# Patient Record
Sex: Female | Born: 1970 | Race: White | Hispanic: No | Smoking: Former smoker
Health system: Southern US, Community
[De-identification: ages and names within clinical notes are randomized; demographics above are authoritative.]

## PROBLEM LIST (undated history)

## (undated) DIAGNOSIS — G4730 Sleep apnea, unspecified: Secondary | ICD-10-CM

## (undated) DIAGNOSIS — N393 Stress incontinence (female) (male): Secondary | ICD-10-CM

## (undated) DIAGNOSIS — F32A Depression, unspecified: Secondary | ICD-10-CM

## (undated) DIAGNOSIS — M1990 Unspecified osteoarthritis, unspecified site: Secondary | ICD-10-CM

## (undated) DIAGNOSIS — F329 Major depressive disorder, single episode, unspecified: Secondary | ICD-10-CM

## (undated) DIAGNOSIS — E282 Polycystic ovarian syndrome: Secondary | ICD-10-CM

## (undated) DIAGNOSIS — K219 Gastro-esophageal reflux disease without esophagitis: Secondary | ICD-10-CM

## (undated) DIAGNOSIS — R51 Headache: Secondary | ICD-10-CM

## (undated) DIAGNOSIS — F99 Mental disorder, not otherwise specified: Secondary | ICD-10-CM

## (undated) DIAGNOSIS — F319 Bipolar disorder, unspecified: Secondary | ICD-10-CM

## (undated) HISTORY — DX: Stress incontinence (female) (male): N39.3

## (undated) HISTORY — DX: Gastro-esophageal reflux disease without esophagitis: K21.9

## (undated) HISTORY — DX: Polycystic ovarian syndrome: E28.2

## (undated) HISTORY — DX: Bipolar disorder, unspecified: F31.9

## (undated) HISTORY — DX: Unspecified osteoarthritis, unspecified site: M19.90

---

## 2004-05-27 ENCOUNTER — Encounter (INDEPENDENT_AMBULATORY_CARE_PROVIDER_SITE_OTHER): Payer: Self-pay | Admitting: Internal Medicine

## 2004-05-27 LAB — CONVERTED CEMR LAB: Pap Smear: NORMAL

## 2004-06-08 ENCOUNTER — Emergency Department (HOSPITAL_COMMUNITY): Admission: EM | Admit: 2004-06-08 | Discharge: 2004-06-08 | Payer: Self-pay | Admitting: Emergency Medicine

## 2005-03-26 ENCOUNTER — Inpatient Hospital Stay (HOSPITAL_COMMUNITY): Admission: RE | Admit: 2005-03-26 | Discharge: 2005-03-29 | Payer: Self-pay | Admitting: Psychiatry

## 2005-03-27 ENCOUNTER — Ambulatory Visit: Payer: Self-pay | Admitting: Psychiatry

## 2005-05-08 ENCOUNTER — Ambulatory Visit: Payer: Self-pay | Admitting: Psychiatry

## 2005-05-30 ENCOUNTER — Ambulatory Visit: Payer: Self-pay | Admitting: Psychiatry

## 2005-07-04 ENCOUNTER — Ambulatory Visit: Payer: Self-pay | Admitting: Psychiatry

## 2005-07-25 ENCOUNTER — Ambulatory Visit: Payer: Self-pay | Admitting: Psychiatry

## 2005-08-01 ENCOUNTER — Ambulatory Visit (HOSPITAL_COMMUNITY): Payer: Self-pay | Admitting: Psychiatry

## 2005-08-16 ENCOUNTER — Ambulatory Visit (HOSPITAL_COMMUNITY): Payer: Self-pay | Admitting: Psychiatry

## 2005-08-22 ENCOUNTER — Ambulatory Visit (HOSPITAL_COMMUNITY): Payer: Self-pay | Admitting: Psychiatry

## 2005-09-19 ENCOUNTER — Ambulatory Visit (HOSPITAL_COMMUNITY): Payer: Self-pay | Admitting: Psychiatry

## 2005-10-17 ENCOUNTER — Ambulatory Visit (HOSPITAL_COMMUNITY): Payer: Self-pay | Admitting: Psychiatry

## 2005-11-07 ENCOUNTER — Ambulatory Visit (HOSPITAL_COMMUNITY): Payer: Self-pay | Admitting: Psychiatry

## 2005-12-19 ENCOUNTER — Ambulatory Visit (HOSPITAL_COMMUNITY): Payer: Self-pay | Admitting: Psychiatry

## 2006-04-08 ENCOUNTER — Ambulatory Visit (HOSPITAL_COMMUNITY): Payer: Self-pay | Admitting: Psychiatry

## 2006-04-30 ENCOUNTER — Ambulatory Visit: Payer: Self-pay | Admitting: Internal Medicine

## 2006-04-30 LAB — CONVERTED CEMR LAB
HDL: 41 mg/dL
LDL Cholesterol: 134 mg/dL

## 2006-05-31 ENCOUNTER — Encounter: Payer: Self-pay | Admitting: Internal Medicine

## 2006-05-31 DIAGNOSIS — G43909 Migraine, unspecified, not intractable, without status migrainosus: Secondary | ICD-10-CM | POA: Insufficient documentation

## 2006-05-31 DIAGNOSIS — J45909 Unspecified asthma, uncomplicated: Secondary | ICD-10-CM | POA: Insufficient documentation

## 2006-05-31 DIAGNOSIS — R32 Unspecified urinary incontinence: Secondary | ICD-10-CM | POA: Insufficient documentation

## 2006-05-31 DIAGNOSIS — Z9189 Other specified personal risk factors, not elsewhere classified: Secondary | ICD-10-CM | POA: Insufficient documentation

## 2006-05-31 DIAGNOSIS — E282 Polycystic ovarian syndrome: Secondary | ICD-10-CM | POA: Insufficient documentation

## 2006-05-31 DIAGNOSIS — K219 Gastro-esophageal reflux disease without esophagitis: Secondary | ICD-10-CM | POA: Insufficient documentation

## 2006-05-31 DIAGNOSIS — J3089 Other allergic rhinitis: Secondary | ICD-10-CM | POA: Insufficient documentation

## 2006-05-31 DIAGNOSIS — F329 Major depressive disorder, single episode, unspecified: Secondary | ICD-10-CM | POA: Insufficient documentation

## 2006-05-31 DIAGNOSIS — F411 Generalized anxiety disorder: Secondary | ICD-10-CM | POA: Insufficient documentation

## 2006-05-31 DIAGNOSIS — E669 Obesity, unspecified: Secondary | ICD-10-CM | POA: Insufficient documentation

## 2006-05-31 DIAGNOSIS — F319 Bipolar disorder, unspecified: Secondary | ICD-10-CM | POA: Insufficient documentation

## 2006-06-10 ENCOUNTER — Ambulatory Visit: Payer: Self-pay | Admitting: Internal Medicine

## 2006-06-10 LAB — CONVERTED CEMR LAB: CO2: 20 meq/L (ref 19–32)

## 2006-07-08 ENCOUNTER — Ambulatory Visit (HOSPITAL_COMMUNITY): Payer: Self-pay | Admitting: Psychiatry

## 2006-09-17 ENCOUNTER — Encounter (INDEPENDENT_AMBULATORY_CARE_PROVIDER_SITE_OTHER): Payer: Self-pay | Admitting: Internal Medicine

## 2006-09-19 ENCOUNTER — Ambulatory Visit: Payer: Self-pay | Admitting: Internal Medicine

## 2006-09-19 DIAGNOSIS — M549 Dorsalgia, unspecified: Secondary | ICD-10-CM | POA: Insufficient documentation

## 2006-09-19 DIAGNOSIS — L680 Hirsutism: Secondary | ICD-10-CM | POA: Insufficient documentation

## 2006-09-23 ENCOUNTER — Encounter (INDEPENDENT_AMBULATORY_CARE_PROVIDER_SITE_OTHER): Payer: Self-pay | Admitting: Internal Medicine

## 2006-10-07 ENCOUNTER — Ambulatory Visit (HOSPITAL_COMMUNITY): Payer: Self-pay | Admitting: Psychiatry

## 2007-01-02 ENCOUNTER — Encounter (INDEPENDENT_AMBULATORY_CARE_PROVIDER_SITE_OTHER): Payer: Self-pay | Admitting: Internal Medicine

## 2007-01-06 ENCOUNTER — Ambulatory Visit (HOSPITAL_COMMUNITY): Payer: Self-pay | Admitting: Psychiatry

## 2007-03-23 ENCOUNTER — Ambulatory Visit: Payer: Self-pay | Admitting: Internal Medicine

## 2007-03-23 DIAGNOSIS — J209 Acute bronchitis, unspecified: Secondary | ICD-10-CM | POA: Insufficient documentation

## 2007-03-23 LAB — CONVERTED CEMR LAB: Inflenza A Ag: NEGATIVE

## 2007-03-25 ENCOUNTER — Encounter (INDEPENDENT_AMBULATORY_CARE_PROVIDER_SITE_OTHER): Payer: Self-pay | Admitting: Internal Medicine

## 2007-03-25 ENCOUNTER — Telehealth (INDEPENDENT_AMBULATORY_CARE_PROVIDER_SITE_OTHER): Payer: Self-pay | Admitting: Internal Medicine

## 2007-04-06 ENCOUNTER — Telehealth (INDEPENDENT_AMBULATORY_CARE_PROVIDER_SITE_OTHER): Payer: Self-pay | Admitting: *Deleted

## 2007-04-09 ENCOUNTER — Encounter (INDEPENDENT_AMBULATORY_CARE_PROVIDER_SITE_OTHER): Payer: Self-pay | Admitting: Internal Medicine

## 2007-04-16 ENCOUNTER — Ambulatory Visit (HOSPITAL_COMMUNITY): Payer: Self-pay | Admitting: Psychiatry

## 2007-05-09 ENCOUNTER — Encounter (INDEPENDENT_AMBULATORY_CARE_PROVIDER_SITE_OTHER): Payer: Self-pay | Admitting: Internal Medicine

## 2007-06-10 ENCOUNTER — Ambulatory Visit: Payer: Self-pay | Admitting: Internal Medicine

## 2007-06-10 DIAGNOSIS — M545 Low back pain, unspecified: Secondary | ICD-10-CM | POA: Insufficient documentation

## 2007-06-10 DIAGNOSIS — Z87891 Personal history of nicotine dependence: Secondary | ICD-10-CM | POA: Insufficient documentation

## 2007-08-18 ENCOUNTER — Ambulatory Visit (HOSPITAL_COMMUNITY): Payer: Self-pay | Admitting: Psychiatry

## 2007-11-05 ENCOUNTER — Ambulatory Visit (HOSPITAL_COMMUNITY): Payer: Self-pay | Admitting: Psychiatry

## 2007-12-15 ENCOUNTER — Telehealth (INDEPENDENT_AMBULATORY_CARE_PROVIDER_SITE_OTHER): Payer: Self-pay | Admitting: *Deleted

## 2008-01-20 ENCOUNTER — Ambulatory Visit: Payer: Self-pay | Admitting: Internal Medicine

## 2008-01-25 LAB — CONVERTED CEMR LAB
CO2: 22 meq/L (ref 19–32)
Calcium: 9.5 mg/dL (ref 8.4–10.5)
Creatinine, Ser: 0.79 mg/dL (ref 0.40–1.20)
Glucose, Bld: 111 mg/dL — ABNORMAL HIGH (ref 70–99)

## 2008-03-03 ENCOUNTER — Ambulatory Visit (HOSPITAL_COMMUNITY): Payer: Self-pay | Admitting: Psychiatry

## 2008-03-11 ENCOUNTER — Encounter (INDEPENDENT_AMBULATORY_CARE_PROVIDER_SITE_OTHER): Payer: Self-pay | Admitting: Internal Medicine

## 2008-03-25 ENCOUNTER — Other Ambulatory Visit: Admission: RE | Admit: 2008-03-25 | Discharge: 2008-03-25 | Payer: Self-pay | Admitting: Internal Medicine

## 2008-03-25 ENCOUNTER — Ambulatory Visit: Payer: Self-pay | Admitting: Internal Medicine

## 2008-03-25 ENCOUNTER — Encounter (INDEPENDENT_AMBULATORY_CARE_PROVIDER_SITE_OTHER): Payer: Self-pay | Admitting: Internal Medicine

## 2008-03-25 DIAGNOSIS — R7309 Other abnormal glucose: Secondary | ICD-10-CM | POA: Insufficient documentation

## 2008-03-28 LAB — CONVERTED CEMR LAB: Glucose, Bld: 104 mg/dL — ABNORMAL HIGH (ref 70–99)

## 2008-06-15 ENCOUNTER — Ambulatory Visit: Payer: Self-pay | Admitting: Internal Medicine

## 2008-06-15 DIAGNOSIS — J069 Acute upper respiratory infection, unspecified: Secondary | ICD-10-CM | POA: Insufficient documentation

## 2008-08-25 ENCOUNTER — Ambulatory Visit (HOSPITAL_COMMUNITY): Payer: Self-pay | Admitting: Psychiatry

## 2008-12-02 ENCOUNTER — Encounter (INDEPENDENT_AMBULATORY_CARE_PROVIDER_SITE_OTHER): Payer: Self-pay | Admitting: Internal Medicine

## 2009-01-24 ENCOUNTER — Ambulatory Visit: Payer: Self-pay | Admitting: Internal Medicine

## 2009-01-24 DIAGNOSIS — M79609 Pain in unspecified limb: Secondary | ICD-10-CM | POA: Insufficient documentation

## 2009-01-26 ENCOUNTER — Ambulatory Visit (HOSPITAL_COMMUNITY): Payer: Self-pay | Admitting: Psychiatry

## 2009-02-01 ENCOUNTER — Encounter (INDEPENDENT_AMBULATORY_CARE_PROVIDER_SITE_OTHER): Payer: Self-pay | Admitting: Internal Medicine

## 2009-07-25 ENCOUNTER — Ambulatory Visit (HOSPITAL_COMMUNITY): Payer: Self-pay | Admitting: Psychiatry

## 2009-08-23 ENCOUNTER — Ambulatory Visit: Payer: Self-pay | Admitting: Orthopedic Surgery

## 2009-08-23 DIAGNOSIS — IMO0002 Reserved for concepts with insufficient information to code with codable children: Secondary | ICD-10-CM | POA: Insufficient documentation

## 2009-08-23 DIAGNOSIS — M2550 Pain in unspecified joint: Secondary | ICD-10-CM | POA: Insufficient documentation

## 2009-08-23 DIAGNOSIS — M1710 Unilateral primary osteoarthritis, unspecified knee: Secondary | ICD-10-CM

## 2009-08-25 ENCOUNTER — Encounter (INDEPENDENT_AMBULATORY_CARE_PROVIDER_SITE_OTHER): Payer: Self-pay | Admitting: *Deleted

## 2009-09-04 ENCOUNTER — Encounter (INDEPENDENT_AMBULATORY_CARE_PROVIDER_SITE_OTHER): Payer: Self-pay | Admitting: *Deleted

## 2009-09-12 ENCOUNTER — Encounter: Payer: Self-pay | Admitting: Orthopedic Surgery

## 2009-09-19 ENCOUNTER — Telehealth: Payer: Self-pay | Admitting: Orthopedic Surgery

## 2009-10-09 ENCOUNTER — Telehealth: Payer: Self-pay | Admitting: Orthopedic Surgery

## 2009-10-31 ENCOUNTER — Ambulatory Visit (HOSPITAL_COMMUNITY): Payer: Self-pay | Admitting: Psychiatry

## 2010-01-30 ENCOUNTER — Ambulatory Visit (HOSPITAL_COMMUNITY): Payer: Self-pay | Admitting: Psychiatry

## 2010-05-01 ENCOUNTER — Ambulatory Visit (HOSPITAL_COMMUNITY): Payer: Self-pay | Admitting: Psychiatry

## 2010-06-26 NOTE — Consult Note (Signed)
Summary: Consult notes from Dr. Harlow Mares  Consult notes from Dr. Harlow Mares   Imported By: Jacklynn Ganong 09/12/2009 11:10:30  _____________________________________________________________________  External Attachment:    Type:   Image     Comment:   External Document

## 2010-06-26 NOTE — Letter (Signed)
Summary: Historic Patient File  Historic Patient File   Imported By: Elvera Maria 08/25/2009 10:29:43  _____________________________________________________________________  External Attachment:    Type:   Image     Comment:   history form

## 2010-06-26 NOTE — Progress Notes (Signed)
Summary: no reply to referral appointment  Phone Note Other Incoming   Summary of Call: Someone left a message (did not leave a name) from [facility] that they have left messages for [name] to call their office to schedule an appointment with Dr. Corliss Skains.  So far there has been no reply to the messages. Initial call taken by: Jacklynn Ganong,  September 19, 2009 4:16 PM

## 2010-06-26 NOTE — Miscellaneous (Signed)
Summary: faxed referral to dr Dareen Piano for rheumatoid per pt  Clinical Lists Changes  Orders: Added new Referral order of Rheumatology Referral (Rheumatology) - Signed

## 2010-06-26 NOTE — Miscellaneous (Signed)
Summary: faxed notes to deveshwar for referral  Clinical Lists Changes

## 2010-06-26 NOTE — Letter (Signed)
Summary: *Orthopedic Consult Note  [facility]  [address] [place]  [place], [place] [address]   Phone: [phone]  Fax: [phone]    Re:    [name] DOB:    Aug 20, 1970   Dear: [name]   Thank you for requesting that we see the above patient for consultation.  A copy of the detailed office note will be sent under separate cover, for your review.  Evaluation today is consistent with:  1)  KNEE, ARTHRITIS, DEGEN./OSTEO (ICD-715.96) 2)  PAIN IN JOINT, MULTIPLE SITES (ICD-719.49)   Our recommendation is for: referral to rheumatologist.  She is not a surgical candidate at this time based on age and x-ray findings.       Thank you for this opportunity to look after your patient.  Sincerely,   Terrance Mass. MD.

## 2010-06-26 NOTE — Progress Notes (Signed)
Summary: unable to contact patient  Phone Note Other Incoming   Summary of Call: [name]/[facility] has not been able to reach [name] to schedule an appointment with Dr. Corliss Skains.. None of her calls have been returned.   Said she will shred all paperwork saent from out office. Initial call taken by: Jacklynn Ganong,  Oct 09, 2009 2:02 PM

## 2010-06-26 NOTE — Assessment & Plan Note (Signed)
Summary: LT KNEE PAIN/DEGEN ARTHRITIS/REF GUARINO/HAD XR"s,BRING'G FIL...   Vital Signs:  Patient profile:   40 year old female Pulse rate:   78 / minute Resp:     16 per minute  Vitals Entered By: Fuller Canada MD (August 23, 2009 10:37 AM)  Visit Type:  E/M Referring Provider:  Dr. Wende Crease Primary Provider:  DR Loney Hering  CC:  left knee pain.  History of Present Illness: 40 year old female with long history of multiple joint aches and pains with frequent flares of knee swelling primarily the LEFT knee and occasionally the RIGHT knee.  She's had pain in her knees for over 20 years.  She couldn't see her primary care physician so she went to the urgent care clinic and eventually had x-rays and an MRI of the LEFT knee she has osteoarthritis no meniscal tears no ligament damage but it is quite severe for her age of 8.  She complains of sharp throbbing mild pain which is constant worse in the morning and after exercise improve with rest heat ibuprofen and prednisone associated with some catching swelling and locking.  Today she is having some back pain. Xrays today.  Medications: Nexium, Wellbutrin, Serequil, Ambien, Diflocenac sodium, Flexeril, Spirolacton.  Allergies: 1)  Pcn 2)  Ceclor  Review of Systems Constitutional:  Denies weight loss, weight gain, fever, chills, and fatigue. Cardiovascular:  Denies chest pain, palpitations, fainting, and murmurs. Respiratory:  Complains of snoring; denies short of breath, wheezing, couch, tightness, pain on inspiration, and snoring . Gastrointestinal:  Complains of heartburn; denies nausea, vomiting, diarrhea, constipation, and blood in your stools. Genitourinary:  Denies frequency, urgency, difficulty urinating, painful urination, flank pain, and bleeding in urine. Neurologic:  Complains of numbness and tingling; denies unsteady gait, dizziness, tremors, and seizure. Musculoskeletal:  Complains of joint pain, swelling, instability, stiffness,  heat, and muscle pain; denies redness. Endocrine:  Denies excessive thirst, exessive urination, and heat or cold intolerance. Psychiatric:  Complains of depression and anxiety; denies nervousness and hallucinations. Skin:  Denies changes in the skin, poor healing, rash, itching, and redness. HEENT:  Denies blurred or double vision, eye pain, redness, and watering. Immunology:  Complains of seasonal allergies; denies sinus problems and allergic to bee stings; Adverse reaction to food- milk protein. Hemoatologic:  Denies easy bleeding and brusing.  Physical Exam  Additional Exam:  GEN: well developed, well nourished, normal grooming and hygiene, no deformity and normal body habitus.   CDV: pulses are normal, no edema, no erythema. no tenderness  Lymph: normal lymph nodes   Skin: no rashes, skin lesions or open sores   NEURO: normal coordination, reflexes, sensation.   Psyche: awake, alert and oriented. Mood normal   Gait: no significant limp  Bilateral knee examination  She has full range of motion in flexion but has a 10 flexion contracture in both knees.  She has some joint tenderness.  Her motor strength is normal.  The knees are stable.  There is no meniscal signs.     Impression & Recommendations:  Problem # 1:  PAIN IN JOINT, MULTIPLE SITES (ICD-719.49) Assessment New x-rays and MRI do show that she has osteoarthritis reports are reviewed together with the images.  There is no meniscal or ligament damage.  She has no deformity in the knee but just has multiple osteophytes in the joint.   Orders: Rheumatology Referral (Rheumatology) New Patient Level III ([address])  Problem # 2:  KNEE, ARTHRITIS, DEGEN./OSTEO (ICD-715.96) Assessment: New  Her updated medication list for this problem  includes:    Cyclobenzaprine Hcl 10 Mg Tabs (Cyclobenzaprine hcl) .Marland Kitchen... 1 by mouth three times a day as needed back pain    Hydrocodone-acetaminophen 5-500 Mg Tabs  (Hydrocodone-acetaminophen) .Marland Kitchen... 1 by mouth q 6 hours as needed pain    Diclofenac Sodium 75 Mg Tbec (Diclofenac sodium) .Marland Kitchen... 1 by mouth two times a day   Assessment: She has multiple joint aches and pains with intermittent swelling.  She is not a surgical candidate because of her age.  Her x-rays show moderate arthritis her MRI shows no meniscal lesion  She is basically in need of medical management and may need a rheumatology workup  She is referred for that.  When she worsens of course she would be a candidate for knee replacement at an age of 52 or more.  Orders: New Patient Level III ([address])  Patient Instructions: 1)  Rheumatologist referral: multiple joint aches

## 2010-07-31 ENCOUNTER — Encounter (INDEPENDENT_AMBULATORY_CARE_PROVIDER_SITE_OTHER): Payer: BC Managed Care – PPO | Admitting: Psychiatry

## 2010-07-31 DIAGNOSIS — F319 Bipolar disorder, unspecified: Secondary | ICD-10-CM

## 2010-10-12 NOTE — Discharge Summary (Signed)
[name], [name]             ACCOUNT NO.:  [id number]   MEDICAL RECORD NO.:  [id number]          PATIENT TYPE:  IPS   LOCATION:  0502                          [facility]ACILITY:  [facility]   PHYSICIAN:  Anselm Jungling, MD  DATE OF BIRTH:  19-Mar-1971   DATE OF ADMISSION:  03/26/2005  DATE OF DISCHARGE:                                 DISCHARGE SUMMARY   IDENTIFYING DATA AND REASON FOR ADMISSION:  The patient is a 40 year old  married female who was admitted on a voluntary basis. She was transferred to  [place] from the hospital setting, where she had been treated for a severe  intentional overdose suicide attempt that occurred on 03/18/2005. Her  husband had found her unresponsive on the floor. 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 However, she recovered to an extent that exceeded  the prognosis, and agreed to come to inpatient psychiatry for further  treatment. She had been under the care of Dr. Elnoria Howard, her psychiatrist. She  had also been seeing a therapist, Irish Lack. Please refer to the admission  note for further details pertaining to the symptoms, circumstances and  history that led to her hospitalization.   The patient was given an initial Axis I diagnosis of bipolar disorder NOS,  mixed state.   MEDICAL AND LABORATORY:  As referenced above, the patient was transferred  directly from the medical setting. In addition to having been treated for  her overdose suicide attempt, she came to [place] with a history of GERD,  Barrett's esophagitis, arthritis NOS, and pneumonitis. She was still on a  regimen of prednisone 10 milligrams daily x3 more days, Levaquin 750  milligrams daily x3 more days. She had previously been taking Lexapro 20  milligrams for 2 years, and recently had been prescribed Klonopin. The  patient was assessed by the nurse practitioner at the onset of her  psychiatric treatment and  followed throughout her stay. There were no  significant medical issues during the psychiatric portion of her treatment.   HOSPITAL COURSE:  The patient was admitted to the adult inpatient  psychiatric service. She was continued on Levaquin 750 milligrams daily for  2 days, prednisone 10 milligrams daily for 2 days, daily multivitamin,  Protonix 40 milligrams daily for GERD. She was continued on lithium  sustained release 300 milligrams q. evening, and Geodon 60 milligrams, up to  t.i.d.. Medications were all well tolerated.   The patient participated reasonably well in therapeutic groups and  activities on the inpatient unit, however, from the very beginning of her  treatment, she began talking of wanting to have a very brief inpatient stay  and hoped to go home within 1-2 days of her admission at the psychiatric  unit. Her mood appeared to be neutral, with bright affect. She did not  appear to be overtly depressed. She did not seem to be troubled by the  seriousness of the suicide attempt that she had just made, and consistently  indicated throughout that she was no longer suicidal or having any  thoughts  of self-harm. She did not seem to feel it was necessary to do much in the  way of processing or reflecting on stressors that led to her overdose. She  claimed at times that she could not really recall what led to the overdose,  although evidence available indicated that it was very deliberate, well  planned act. This gave the undersigned and treatment staff concerns about  the patient's overall sincerity. Nonetheless, the patient indicated that she  was feeling very well, and stated, on the third hospital day, I am not a  danger to myself or anyone as she insisted on discharge that day. She also  stated what do I have to do or say to convince you to let me go home  today.   It was felt that a family session with the patient's husband was essential  prior to discharge. This was  arranged for 03/28/05. In that meeting, the  patient indicated that she felt ready for discharge home. Her husband  indicated that he had a great deal of anxiety about her coming home, but was  accepting of her decision to leave the hospital setting. It was mentioned  that their 10 year old daughter was uncomfortable enough around mother that  she had made a decision to live with her grandparents for the time being.   The patient was discharged on the following morning. On the day of  discharge, the patient continued to indicate that she was feeling perfectly  well without any particular complaints, without depressed mood, without  suicidal ideation or thoughts of self-harm, and without any particular  anxiety about how she would do after she left the hospital setting. We did  discuss aftercare plans which included seeing Dr. Lolly Mustache, and Florencia Reasons  for individual psychotherapy.   AFTERCARE:  As above. The patient was to follow-up with Dr. Lolly Mustache at the  [facility] outpatient clinic on 04/09/2005, and her first  session with Florencia Reasons on 04/04/2005.   DISCHARGE MEDICATIONS:  Lithobid 300 milligrams daily, Geodon 60 milligrams  t.i.d., and Protonix 40 milligrams daily. She was to follow-up with her  usual medical providers regarding non psychiatric medical matters.   DISCHARGE DIAGNOSES:  AXIS I:  Bipolar disorder by history, most recently  mixed, without psychotic features.  AXIS II: Deferred  AXIS III: Gastroesophageal reflux disease, Barrett's esophagitis, arthritis  not otherwise specified, status post overdose.  AXIS IV: Stressors severe.  AXIS V: GAF on discharge 65.   I should add that at no time during her inpatient stay was there anything to  suggest that the patient had suffered any anoxic brain injury. She was  alert, fully oriented, showed clear sensorium, and no evidence of any cognitive impairment or limitation at all. Her thought processes were   normally organized, and her thought processing, speed and clarity appeared  to be quite good.           ______________________________  Anselm Jungling, MD  Electronically Signed     SPB/MEDQ  D:  03/29/2005  T:  03/29/2005  Job:  [id number]

## 2010-10-30 ENCOUNTER — Encounter (HOSPITAL_COMMUNITY): Payer: BC Managed Care – PPO | Admitting: Psychiatry

## 2010-10-30 ENCOUNTER — Encounter (INDEPENDENT_AMBULATORY_CARE_PROVIDER_SITE_OTHER): Payer: BC Managed Care – PPO | Admitting: Psychiatry

## 2010-10-30 DIAGNOSIS — F3189 Other bipolar disorder: Secondary | ICD-10-CM

## 2010-11-20 ENCOUNTER — Encounter (HOSPITAL_COMMUNITY): Payer: BC Managed Care – PPO | Admitting: Psychiatry

## 2010-11-22 ENCOUNTER — Encounter (HOSPITAL_COMMUNITY): Payer: BC Managed Care – PPO | Admitting: Psychiatry

## 2010-12-13 ENCOUNTER — Encounter (INDEPENDENT_AMBULATORY_CARE_PROVIDER_SITE_OTHER): Payer: BC Managed Care – PPO | Admitting: Psychiatry

## 2010-12-13 DIAGNOSIS — F319 Bipolar disorder, unspecified: Secondary | ICD-10-CM

## 2011-01-10 ENCOUNTER — Encounter (INDEPENDENT_AMBULATORY_CARE_PROVIDER_SITE_OTHER): Payer: BC Managed Care – PPO | Admitting: Psychiatry

## 2011-01-10 DIAGNOSIS — F319 Bipolar disorder, unspecified: Secondary | ICD-10-CM

## 2011-02-21 ENCOUNTER — Encounter (INDEPENDENT_AMBULATORY_CARE_PROVIDER_SITE_OTHER): Payer: BC Managed Care – PPO | Admitting: Psychiatry

## 2011-02-21 DIAGNOSIS — F3189 Other bipolar disorder: Secondary | ICD-10-CM

## 2011-03-21 ENCOUNTER — Encounter (INDEPENDENT_AMBULATORY_CARE_PROVIDER_SITE_OTHER): Payer: BC Managed Care – PPO | Admitting: Psychiatry

## 2011-03-21 DIAGNOSIS — F3189 Other bipolar disorder: Secondary | ICD-10-CM

## 2011-05-10 ENCOUNTER — Other Ambulatory Visit (HOSPITAL_COMMUNITY): Payer: Self-pay | Admitting: Psychiatry

## 2011-06-13 ENCOUNTER — Ambulatory Visit (INDEPENDENT_AMBULATORY_CARE_PROVIDER_SITE_OTHER): Payer: BC Managed Care – PPO | Admitting: Psychiatry

## 2011-06-13 ENCOUNTER — Encounter (HOSPITAL_COMMUNITY): Payer: Self-pay | Admitting: Psychiatry

## 2011-06-13 ENCOUNTER — Encounter (HOSPITAL_COMMUNITY): Payer: BC Managed Care – PPO | Admitting: Psychiatry

## 2011-06-13 VITALS — Wt 222.0 lb

## 2011-06-13 DIAGNOSIS — F319 Bipolar disorder, unspecified: Secondary | ICD-10-CM

## 2011-06-13 MED ORDER — LITHIUM CARBONATE ER 450 MG PO TBCR: EXTENDED_RELEASE_TABLET | ORAL | Status: DC

## 2011-06-13 MED ORDER — BUPROPION HCL ER (XL) 300 MG PO TB24: 300.0000 mg | ORAL_TABLET | Freq: Every day | ORAL | Status: DC

## 2011-06-13 NOTE — Progress Notes (Signed)
Patient came for her followup appointment. Her mother after her surgery for her kidney cancer developed complications and now she is in assisted living facility. Patient admitted a lot of tension between family member she is able to trying herself out of the stress. She has a tense Christmas and holidays however now she is doing better. She reported that her husband also believe she is doing very well on current medication. She still takes Seroquel "one fourth which helps her sleep. She denies any agitation anger or mood swings. She admitted that she stopped exercise however recently started again. She also cut down her caffeine. Overall she believed current medication working very well. She denies any crying spells insomnia or racing thoughts.  Mental status examination Patient is casually dressed and well groomed. Her speech is soft clear and coherent. Her thought process is logical linear and goal-directed. She described her mood is anxious and her affect is mood congruent. She denies any active or passive suicidal thoughts or homicidal thoughts. There no psychotic symptoms present at this time. There no tremors or extrapyramidal side effects noted. She's alert and oriented x3. Her insight judgment and impulse control is okay  Assessment Bipolar disorder  Plan For now we'll continue her current medication which is Seroquel 100 mg but she is taking one fourth, lithium ER 450 mg 2 at bedtime and Wellbutrin XL 300 mg daily. I have explained the risk and benefit medication. I recommended if she start feeling worsening of symptoms or having issues but medication that she need to call us. I will see her again in 3 months and we will consider lab work on that visit.

## 2011-06-16 ENCOUNTER — Other Ambulatory Visit (HOSPITAL_COMMUNITY): Payer: Self-pay | Admitting: Psychiatry

## 2011-09-10 ENCOUNTER — Encounter (HOSPITAL_COMMUNITY): Payer: Self-pay | Admitting: Psychiatry

## 2011-09-10 ENCOUNTER — Ambulatory Visit (INDEPENDENT_AMBULATORY_CARE_PROVIDER_SITE_OTHER): Payer: BC Managed Care – PPO | Admitting: Psychiatry

## 2011-09-10 VITALS — Wt 219.0 lb

## 2011-09-10 DIAGNOSIS — Z79899 Other long term (current) drug therapy: Secondary | ICD-10-CM

## 2011-09-10 DIAGNOSIS — F319 Bipolar disorder, unspecified: Secondary | ICD-10-CM

## 2011-09-10 MED ORDER — LITHIUM CARBONATE ER 450 MG PO TBCR: EXTENDED_RELEASE_TABLET | ORAL | Status: DC

## 2011-09-10 MED ORDER — BUPROPION HCL ER (XL) 300 MG PO TB24: 300.0000 mg | ORAL_TABLET | Freq: Every day | ORAL | Status: DC

## 2011-09-10 NOTE — Progress Notes (Signed)
Chief complaint Medication management and followup.  History of present illness Patient came for her followup appointment with her husband.  Patient is doing better on her medication.  She still takes Seroquel 1/4th and believed that works fine.  She's sleeping good.  She denies any agitation anger or mood swings.  Husband also endorse improved mood and do not recall any mood swings or ups or down in behavior .  Patient daughter is graduating from the school in may and she is concerned about gathering .  Patient endorse that they would be a lot of family member on that day .  Overall patient mood has been stable.  She sleeps fine.  She is more involved in outdoor activity.  She denies any drinking or using any illegal substance however she regularly goes to dancing.  She has lost 2 pounds from her last visit.  She denies any concerns or side effects of medication.  She denies any anger or agitation or any crying spells.  She does not have any concern on her medication.  Current psychiatric medication Wellbutrin XL 300 mg daily Lithium carbonate for 50 mg 2 at bedtime Seroquel 100 mg and she takes 1/4th.  Medical history Arthritis, GERD, polycystic ovary, stress incontinence .    Mental status examination Patient is casually dressed and well groomed. Her speech is soft clear and coherent. Her thought process is logical linear and goal-directed. She described her mood is pleasant and good and her affect is mood congruent. She denies any active or passive suicidal thoughts or homicidal thoughts. There no psychotic symptoms present at this time. There no tremors or extrapyramidal side effects noted. She's alert and oriented x3. Her insight judgment and impulse control is okay  Assessment Axis I Bipolar disorder Axis II deferred Axis III GERD, arthritis polycystic ovary, stress incontinence Axis IV mild to moderate Axis V 60-65  Plan I will continue her current medication.  Patient is stable on  her medication and denies any side effects.  I will do routine blood work including CBC, CMP, hemoglobin A1c and lithium level.  I recommend to call us if she is any question or concern about the medication otherwise of see her again in 3 months.

## 2011-09-24 ENCOUNTER — Other Ambulatory Visit (HOSPITAL_COMMUNITY): Payer: Self-pay | Admitting: Psychiatry

## 2011-09-24 DIAGNOSIS — F319 Bipolar disorder, unspecified: Secondary | ICD-10-CM

## 2011-09-24 MED ORDER — QUETIAPINE FUMARATE 100 MG PO TABS: ORAL_TABLET | ORAL | Status: DC

## 2011-10-23 ENCOUNTER — Other Ambulatory Visit (HOSPITAL_COMMUNITY): Payer: Self-pay | Admitting: Psychiatry

## 2011-10-23 NOTE — Telephone Encounter (Signed)
Given 90 days supply on 09/10/11. Too soon to refill.

## 2011-12-05 ENCOUNTER — Other Ambulatory Visit: Payer: Self-pay | Admitting: Orthopedic Surgery

## 2011-12-05 MED ORDER — BUPIVACAINE 0.25 % ON-Q PUMP SINGLE CATH 300ML: 300.0000 mL | INJECTION | Status: DC

## 2011-12-05 MED ORDER — DEXAMETHASONE SODIUM PHOSPHATE 10 MG/ML IJ SOLN: 10.0000 mg | Freq: Once | INTRAMUSCULAR | Status: DC

## 2011-12-05 NOTE — Progress Notes (Signed)
Preoperative surgical orders have been place into the Epic hospital system for [name] on 12/05/2011, 4:50 PM  by Patrica Duel for surgery on 02/26/2012.  Preop Total Knee orders including Bupivacaine On-Q pump, IV Tylenol, and IV Decadron as long as there are no contraindications to the above medications. Avel Peace, PA-C

## 2011-12-12 ENCOUNTER — Encounter (HOSPITAL_COMMUNITY): Payer: Self-pay | Admitting: Psychiatry

## 2011-12-12 ENCOUNTER — Ambulatory Visit (INDEPENDENT_AMBULATORY_CARE_PROVIDER_SITE_OTHER): Payer: BC Managed Care – PPO | Admitting: Psychiatry

## 2011-12-12 VITALS — Wt 221.0 lb

## 2011-12-12 DIAGNOSIS — F319 Bipolar disorder, unspecified: Secondary | ICD-10-CM

## 2011-12-12 MED ORDER — BUPROPION HCL ER (XL) 300 MG PO TB24: 300.0000 mg | ORAL_TABLET | Freq: Every day | ORAL | Status: DC

## 2011-12-12 MED ORDER — LITHIUM CARBONATE ER 450 MG PO TBCR: EXTENDED_RELEASE_TABLET | ORAL | Status: DC
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
## 2012-02-13 ENCOUNTER — Encounter (HOSPITAL_COMMUNITY): Payer: Self-pay | Admitting: Pharmacy Technician

## 2012-02-20 ENCOUNTER — Encounter (HOSPITAL_COMMUNITY): Payer: Self-pay

## 2012-02-20 ENCOUNTER — Encounter (HOSPITAL_COMMUNITY)
Admission: RE | Admit: 2012-02-20 | Discharge: 2012-02-20 | Disposition: A | Discharge status: Home / Self Care | Payer: BC Managed Care – PPO | Source: Ambulatory Visit | Attending: Orthopedic Surgery | Admitting: Orthopedic Surgery

## 2012-02-20 HISTORY — DX: Mental disorder, not otherwise specified: F99

## 2012-02-20 HISTORY — DX: Sleep apnea, unspecified: G47.30

## 2012-02-20 HISTORY — DX: Depression, unspecified: F32.A

## 2012-02-20 HISTORY — DX: Major depressive disorder, single episode, unspecified: F32.9

## 2012-02-20 HISTORY — DX: Headache: R51

## 2012-02-20 LAB — COMPREHENSIVE METABOLIC PANEL
ALT: 15 U/L (ref 0–35)
BUN: 8 mg/dL (ref 6–23)
Calcium: 9.2 mg/dL (ref 8.4–10.5)
Creatinine, Ser: 0.88 mg/dL (ref 0.50–1.10)
GFR calc Af Amer: >90 mL/min (ref >90)
GFR calc non Af Amer: 80 mL/min — ABNORMAL LOW (ref >90)
Glucose, Bld: 102 mg/dL — ABNORMAL HIGH (ref 70–99)
Sodium: 135 mEq/L (ref 135–145)
Total Protein: 6.5 g/dL (ref 6.0–8.3)

## 2012-02-20 LAB — SURGICAL PCR SCREEN: MRSA, PCR: NEGATIVE

## 2012-02-20 LAB — URINALYSIS, ROUTINE W REFLEX MICROSCOPIC
Ketones, ur: NEGATIVE mg/dL
Leukocytes, UA: NEGATIVE
Nitrite: NEGATIVE
Protein, ur: NEGATIVE mg/dL
Urobilinogen, UA: 0.2 mg/dL (ref 0.0–1.0)

## 2012-02-20 LAB — CBC
Hemoglobin: 12.8 g/dL (ref 12.0–15.0)
MCH: 31.4 pg (ref 26.0–34.0)
MCHC: 33.4 g/dL (ref 30.0–36.0)
MCV: 93.9 fL (ref 78.0–100.0)

## 2012-02-20 LAB — PROTIME-INR
INR: 0.97 (ref 0.00–1.49)
Prothrombin Time: 12.8 seconds (ref 11.6–15.2)

## 2012-02-20 NOTE — Patient Instructions (Signed)
20 QUINNIE BARCELO  02/20/2012   Your procedure is scheduled on:  02/26/12   Surgery 1030-1200  [facility]  Report to [facility] at  0800     AM.  Call this number if you have problems the morning of surgery: [phone]       Remember:   Do not eat food  Or drink :After Midnight. Tuesday NIGHT   Take these medicines the morning of surgery with A SIP OF WATER: Wellbutrin, Zyrtec, Protonix,              Flonase spray   .  Contacts, dentures or partial plates can not be worn to surgery  Leave suitcase in the car. After surgery it may be brought to your room.  For patients admitted to the hospital, checkout time is 11:00 AM day of  discharge.             SPECIAL INSTRUCTIONS- SEE [facility] PREPARING FOR SURGERY INSTRUCTION SHEET-     DO NOT WEAR JEWELRY, LOTIONS, POWDERS, OR PERFUMES.  WOMEN-- DO NOT SHAVE LEGS OR UNDERARMS FOR 12 HOURS BEFORE SHOWERS. MEN MAY SHAVE FACE.  Patients discharged the day of surgery will not be allowed to drive home. IF going home the day of surgery, you must have a driver and someone to stay with you for the first 24 hours  Name and phone number of your driver:  Husband   TOM                                                                      Please read over the following fact sheets that you were given: MRSA Information, Incentive Spirometry Sheet, Blood Transfusion Sheet  Information                                                                                   Elysse Polidore  PST [phone]  [phone]

## 2012-02-20 NOTE — Pre-Procedure Instructions (Signed)
Instructed to call Dr Salina April office to see if she needs to stop antiinflammatories

## 2012-02-24 ENCOUNTER — Other Ambulatory Visit: Payer: Self-pay | Admitting: Orthopedic Surgery

## 2012-02-24 NOTE — H&P (Signed)
Suhailah H. POWERS  DOB: Sep 18, 1970 Married / Language: English / Race: White Female  Date of Admission:  02/26/2012  Chief Complaint:  Right Knee Pain  History of Present Illness The patient is a 41 year old female who comes in for a preoperative History and Physical. The patient is scheduled for a right total knee arthroplasty to be performed by Dr. Gus Rankin. Aluisio, MD at [facility] on 02/26/2012. She has had problems with her right knee dating back to her teenage years. She injured herself at age 64. She had pain and swelling with that initial injury. She has never had surgery on the right knee. Over the past few years she has had multiple episodes where the knee has locked up on her. She has a constant baseline level of pain but then gets these episodes and has a marked increase in pain. Generally it takes several weeks until she is back to the baseline level. Unfortunately these are occurring more frequently. They are also more painful when they are occurring. She has had multiple cortisone injections in the past. She has had visco supplement injections. She wanted to discuss more definitive treatment for her knee. She continues to work but has a hard time because of these frequent episodes of the severe pain. Dr. Jearl Klinefelter just recently did aspiration and cortisone injection and despite that she has had continued problems. She is now ready to procedd with surgery. They have been treated conservatively in the past for the above stated problem and despite conservative measures, they continue to have progressive pain and severe functional limitations and dysfunction. They have failed non-operative management including home exercise, medications, and injections. It is felt that they would benefit from undergoing total joint replacement. Risks and benefits of the procedure have been discussed with the patient and they elect to proceed with surgery. There are no active contraindications  to surgery such as ongoing infection or rapidly progressive neurological disease.   Problem List/Past Medical Osteoarthritis, Knee (715.96)   Allergies Penicillins. Itching, Rash.   Family History Cerebrovascular Accident. mother and grandmother mothers side Diabetes Mellitus. mother and grandmother mothers side Osteoarthritis. mother and grandmother mothers side Drug / Alcohol Addiction. father and grandfather fathers side Hypertension. mother and grandmother mothers side   Social History Alcohol use. current drinker; drinks hard liquor; only occasionally per week Children. 1 Current work status. working full time Drug/Alcohol Rehab (Currently). no Exercise. Exercises rarely; does running / walking Illicit drug use. no Living situation. live with spouse Marital status. 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 1 Previously in rehab. no Tobacco / smoke exposure. no Pain Contract. no Tobacco use. former smoker; smoke(d) 3 or more pack(s) per day Post-Surgical Plans. Plan is to go home.   Pregnancy / Birth History Pregnant. no   Past Surgical History No pertinent past surgical history  Medical History Depression Osteoarthritis Psychiatric disorder. Bipolar (treated by Dr. Kathryne Sharper [phone] - [facility]) Gastroesophageal Reflux Disease Sleep Apnea   Review of Systems General:Not Present- Chills, Fever, Night Sweats, Fatigue, Weight Gain, Weight Loss and Memory Loss. Skin:Not Present- Hives, Itching, Rash, Eczema and Lesions. HEENT:Not Present- Tinnitus, Headache, Double Vision, Visual Loss, Hearing Loss and Dentures. Respiratory:Not Present- Shortness of breath with exertion, Shortness of breath at rest, Allergies, Coughing up blood and Chronic Cough. Cardiovascular:Not Present- Chest Pain, Racing/skipping heartbeats, Difficulty Breathing  Lying Down, Murmur, Swelling and Palpitations. Gastrointestinal:Not Present- Bloody Stool, Heartburn, Abdominal Pain, Vomiting,  Nausea, Constipation, Diarrhea, Difficulty Swallowing, Jaundice and Loss of appetitie. Female Genitourinary:Not Present- Blood in Urine, Urinary frequency, Weak urinary stream, Discharge, Flank Pain, Incontinence, Painful Urination, Urgency, Urinary Retention and Urinating at Night. Musculoskeletal:Present- Joint Pain. Not Present- Muscle Weakness, Muscle Pain, Joint Swelling, Back Pain, Morning Stiffness and Spasms. Neurological:Not Present- Tremor, Dizziness, Blackout spells, Paralysis, Difficulty with balance and Weakness. Psychiatric:Not Present- Insomnia.   Vitals Weight: 220 lb Height: 67.5 in Weight was reported by patient. Height was reported by patient. Body Surface Area: 2.18 m Body Mass Index: 33.95 kg/m Pulse: 84 (Regular) Resp.: 16 (Unlabored) BP: 112/76 (Sitting, Left Arm, Standard)   Physical Exam The physical exam findings are as follows:  Patient is a 41 year old female with continued knee pain.   General Mental Status - Alert, cooperative and good historian. General Appearance- pleasant. Not in acute distress. Orientation- Oriented X3. Build & Nutrition- Well nourished and Well developed.   Head and Neck Head- normocephalic, atraumatic . Neck Global Assessment- supple. no bruit auscultated on the right and no bruit auscultated on the left.   Eye Pupil- Bilateral- Regular and Round. Note: wears glasses Motion- Bilateral- EOMI.   ENMT upper partial denture plate  Chest and Lung Exam Auscultation: Breath sounds:- clear at anterior chest wall and - clear at posterior chest wall. Adventitious sounds:- No Adventitious sounds.   Cardiovascular Auscultation:Rhythm- Regular rate and rhythm. Heart Sounds- S1 WNL and S2 WNL. Murmurs & Other Heart Sounds:Auscultation of the heart reveals - No  Murmurs.   Abdomen Inspection:Contour- Generalized mild distention. Palpation/Percussion:Tenderness- Abdomen is non-tender to palpation. Rigidity (guarding)- Abdomen is soft. Auscultation:Auscultation of the abdomen reveals - Bowel sounds normal.   Female Genitourinary  Not done, not pertinent to present illness  Musculoskeletal  On exam very pleasant, well developed female alert and oriented in no apparent distress. Both hips show normal range of motion with no discomfort. Left knee shows no effusion. Range 0 to 135, slight crepitus on range of motion. No jointline tenderness or instability. Right knee no deformity. Range about 5 to 125, marked crepitus on range of motion. Tender medial and lateral with no instability noted. Pulses, sensation and motor are intact both lower extremities. Gait pattern is significantly antalgic on the right  RADIOGRAPHS: AP both knees and lateral of the right show that she has severe patellofemoral arthritis bone on bone with some erosion of the inferior aspect of the patella. In addition, she has significant squaring off of the condyles medially and laterally. She still has decent joint space left medial and lateral but has squaring of the condyles and large marginal osteophytes.  Assessment & Plan Osteoarthritis, Knee (715.96) Impression: Right Knee  Note: Patient is for a right total knee replacement by Dr. Lequita Halt.  Plan is to go home.  Please note, that the patient had an episode of an suicide attempt after starting klonopin (2005).  PCP - Dr. Dierdre Forth  Psych - Dr. [facility]  Signed electronically by Roberts Gaudy, PA-C

## 2012-02-26 ENCOUNTER — Encounter (HOSPITAL_COMMUNITY): Payer: Self-pay | Admitting: Anesthesiology

## 2012-02-26 ENCOUNTER — Encounter (HOSPITAL_COMMUNITY)
Admission: RE | Disposition: A | Discharge status: Home Health | Payer: Self-pay | Source: Ambulatory Visit | Attending: Orthopedic Surgery

## 2012-02-26 ENCOUNTER — Inpatient Hospital Stay (HOSPITAL_COMMUNITY): Payer: BC Managed Care – PPO | Admitting: Anesthesiology

## 2012-02-26 ENCOUNTER — Inpatient Hospital Stay (HOSPITAL_COMMUNITY)
Admission: RE | Admit: 2012-02-26 | Discharge: 2012-02-28 | DRG: 209 | Disposition: A | Discharge status: Home Health | Payer: BC Managed Care – PPO | Source: Ambulatory Visit | Attending: Orthopedic Surgery | Admitting: Orthopedic Surgery

## 2012-02-26 ENCOUNTER — Encounter (HOSPITAL_COMMUNITY): Payer: Self-pay | Admitting: *Deleted

## 2012-02-26 ENCOUNTER — Other Ambulatory Visit: Payer: Self-pay | Admitting: Orthopedic Surgery

## 2012-02-26 DIAGNOSIS — Z79899 Other long term (current) drug therapy: Secondary | ICD-10-CM

## 2012-02-26 DIAGNOSIS — Z96659 Presence of unspecified artificial knee joint: Secondary | ICD-10-CM

## 2012-02-26 DIAGNOSIS — F3130 Bipolar disorder, current episode depressed, mild or moderate severity, unspecified: Secondary | ICD-10-CM | POA: Diagnosis present

## 2012-02-26 DIAGNOSIS — E669 Obesity, unspecified: Secondary | ICD-10-CM | POA: Diagnosis present

## 2012-02-26 DIAGNOSIS — N393 Stress incontinence (female) (male): Secondary | ICD-10-CM | POA: Diagnosis present

## 2012-02-26 DIAGNOSIS — Z01812 Encounter for preprocedural laboratory examination: Secondary | ICD-10-CM

## 2012-02-26 DIAGNOSIS — D500 Iron deficiency anemia secondary to blood loss (chronic): Secondary | ICD-10-CM

## 2012-02-26 DIAGNOSIS — D62 Acute posthemorrhagic anemia: Secondary | ICD-10-CM | POA: Diagnosis not present

## 2012-02-26 DIAGNOSIS — F319 Bipolar disorder, unspecified: Secondary | ICD-10-CM

## 2012-02-26 DIAGNOSIS — J45909 Unspecified asthma, uncomplicated: Secondary | ICD-10-CM | POA: Diagnosis present

## 2012-02-26 DIAGNOSIS — K219 Gastro-esophageal reflux disease without esophagitis: Secondary | ICD-10-CM | POA: Diagnosis present

## 2012-02-26 DIAGNOSIS — M179 Osteoarthritis of knee, unspecified: Secondary | ICD-10-CM

## 2012-02-26 DIAGNOSIS — E282 Polycystic ovarian syndrome: Secondary | ICD-10-CM | POA: Diagnosis present

## 2012-02-26 DIAGNOSIS — Z6835 Body mass index (BMI) 35.0-35.9, adult: Secondary | ICD-10-CM

## 2012-02-26 DIAGNOSIS — G4730 Sleep apnea, unspecified: Secondary | ICD-10-CM | POA: Diagnosis present

## 2012-02-26 DIAGNOSIS — F411 Generalized anxiety disorder: Secondary | ICD-10-CM | POA: Diagnosis present

## 2012-02-26 DIAGNOSIS — Z87891 Personal history of nicotine dependence: Secondary | ICD-10-CM

## 2012-02-26 DIAGNOSIS — M1710 Unilateral primary osteoarthritis, unspecified knee: Principal | ICD-10-CM | POA: Diagnosis present

## 2012-02-26 DIAGNOSIS — Z880 Allergy status to penicillin: Secondary | ICD-10-CM

## 2012-02-26 DIAGNOSIS — E876 Hypokalemia: Secondary | ICD-10-CM

## 2012-02-26 HISTORY — PX: TOTAL KNEE ARTHROPLASTY: SHX125

## 2012-02-26 LAB — ABO/RH: ABO/RH(D): A POS

## 2012-02-26 SURGERY — ARTHROPLASTY, KNEE, TOTAL
Anesthesia: General | Site: Knee | Laterality: Right | Wound class: Clean

## 2012-02-26 MED ORDER — ACETAMINOPHEN 10 MG/ML IV SOLN
1000.0000 mg | Freq: Once | INTRAVENOUS | Status: AC
  Administered 2012-02-26: 1000 mg via INTRAVENOUS

## 2012-02-26 MED ORDER — QUETIAPINE FUMARATE 100 MG PO TABS
100.0000 mg | ORAL_TABLET | Freq: Every day | ORAL | Status: DC
  Administered 2012-02-26 – 2012-02-27 (×2): 100 mg via ORAL
  Filled 2012-02-26 (×3): qty 1

## 2012-02-26 MED ORDER — DOCUSATE SODIUM 100 MG PO CAPS
100.0000 mg | ORAL_CAPSULE | Freq: Two times a day (BID) | ORAL | Status: DC
  Administered 2012-02-26 – 2012-02-28 (×5): 100 mg via ORAL

## 2012-02-26 MED ORDER — METOCLOPRAMIDE HCL 10 MG PO TABS: 5.0000 mg | ORAL_TABLET | Freq: Three times a day (TID) | ORAL | Status: DC | PRN

## 2012-02-26 MED ORDER — MENTHOL 3 MG MT LOZG: 1.0000 | LOZENGE | OROMUCOSAL | Status: DC | PRN

## 2012-02-26 MED ORDER — PROMETHAZINE HCL 25 MG/ML IJ SOLN: 6.2500 mg | INTRAMUSCULAR | Status: DC | PRN

## 2012-02-26 MED ORDER — METHOCARBAMOL 100 MG/ML IJ SOLN
500.0000 mg | Freq: Four times a day (QID) | INTRAVENOUS | Status: DC | PRN
  Administered 2012-02-26 – 2012-02-27 (×2): 500 mg via INTRAVENOUS
  Filled 2012-02-26 (×2): qty 5

## 2012-02-26 MED ORDER — LORATADINE 10 MG PO TABS
10.0000 mg | ORAL_TABLET | Freq: Every day | ORAL | Status: DC
  Administered 2012-02-27 – 2012-02-28 (×2): 10 mg via ORAL
  Filled 2012-02-26 (×2): qty 1

## 2012-02-26 MED ORDER — POLYETHYLENE GLYCOL 3350 17 G PO PACK: 17.0000 g | PACK | Freq: Every day | ORAL | Status: DC | PRN

## 2012-02-26 MED ORDER — ACETAMINOPHEN 10 MG/ML IV SOLN
1000.0000 mg | Freq: Four times a day (QID) | INTRAVENOUS | Status: AC
  Administered 2012-02-26 – 2012-02-27 (×4): 1000 mg via INTRAVENOUS
  Filled 2012-02-26 (×5): qty 100

## 2012-02-26 MED ORDER — DEXTROSE 5 % IV SOLN
3.0000 g | INTRAVENOUS | Status: AC
  Administered 2012-02-26: 2 g via INTRAVENOUS
  Filled 2012-02-26: qty 3000

## 2012-02-26 MED ORDER — FLEET ENEMA 7-19 GM/118ML RE ENEM: 1.0000 | ENEMA | Freq: Once | RECTAL | Status: AC | PRN

## 2012-02-26 MED ORDER — RIVAROXABAN 10 MG PO TABS
10.0000 mg | ORAL_TABLET | Freq: Every day | ORAL | Status: DC
  Administered 2012-02-27 – 2012-02-28 (×2): 10 mg via ORAL
  Filled 2012-02-26 (×4): qty 1

## 2012-02-26 MED ORDER — PROPOFOL INFUSION 10 MG/ML OPTIME
INTRAVENOUS | Status: DC | PRN
  Administered 2012-02-26: 75 ug/kg/min via INTRAVENOUS

## 2012-02-26 MED ORDER — LITHIUM CARBONATE ER 450 MG PO TBCR
900.0000 mg | EXTENDED_RELEASE_TABLET | Freq: Every day | ORAL | Status: DC
  Administered 2012-02-26 – 2012-02-27 (×2): 900 mg via ORAL
  Filled 2012-02-26 (×3): qty 2

## 2012-02-26 MED ORDER — ONDANSETRON HCL 4 MG/2ML IJ SOLN
4.0000 mg | Freq: Four times a day (QID) | INTRAMUSCULAR | Status: DC | PRN
  Administered 2012-02-26: 4 mg via INTRAVENOUS
  Filled 2012-02-26: qty 2

## 2012-02-26 MED ORDER — METHOCARBAMOL 500 MG PO TABS
500.0000 mg | ORAL_TABLET | Freq: Four times a day (QID) | ORAL | Status: DC | PRN
  Administered 2012-02-26 – 2012-02-28 (×4): 500 mg via ORAL
  Filled 2012-02-26 (×4): qty 1

## 2012-02-26 MED ORDER — PHENOL 1.4 % MT LIQD: 1.0000 | OROMUCOSAL | Status: DC | PRN

## 2012-02-26 MED ORDER — BUPIVACAINE 0.25 % ON-Q PUMP SINGLE CATH 100 ML
INJECTION | Status: DC | PRN
  Administered 2012-02-26: 100 mL

## 2012-02-26 MED ORDER — BISACODYL 10 MG RE SUPP: 10.0000 mg | Freq: Every day | RECTAL | Status: DC | PRN

## 2012-02-26 MED ORDER — HETASTARCH-ELECTROLYTES 6 % IV SOLN
INTRAVENOUS | Status: DC | PRN
  Administered 2012-02-26: 11:00:00 via INTRAVENOUS

## 2012-02-26 MED ORDER — SODIUM CHLORIDE 0.9 % IV SOLN: INTRAVENOUS | Status: DC

## 2012-02-26 MED ORDER — FLUTICASONE PROPIONATE 50 MCG/ACT NA SUSP
1.0000 | Freq: Every day | NASAL | Status: DC
  Administered 2012-02-28: 1 via NASAL
  Filled 2012-02-26: qty 16

## 2012-02-26 MED ORDER — BUPROPION HCL ER (XL) 300 MG PO TB24
300.0000 mg | ORAL_TABLET | Freq: Every day | ORAL | Status: DC
  Administered 2012-02-27 – 2012-02-28 (×2): 300 mg via ORAL
  Filled 2012-02-26 (×2): qty 1

## 2012-02-26 MED ORDER — OXYBUTYNIN CHLORIDE 5 MG PO TABS
5.0000 mg | ORAL_TABLET | Freq: Two times a day (BID) | ORAL | Status: DC
  Administered 2012-02-26 – 2012-02-28 (×4): 5 mg via ORAL
  Filled 2012-02-26 (×5): qty 1

## 2012-02-26 MED ORDER — CEFAZOLIN SODIUM-DEXTROSE 2-3 GM-% IV SOLR
2.0000 g | Freq: Four times a day (QID) | INTRAVENOUS | Status: AC
  Administered 2012-02-26 (×2): 2 g via INTRAVENOUS
  Filled 2012-02-26 (×2): qty 50

## 2012-02-26 MED ORDER — MORPHINE SULFATE 2 MG/ML IJ SOLN
1.0000 mg | INTRAMUSCULAR | Status: DC | PRN
  Administered 2012-02-26 (×3): 1 mg via INTRAVENOUS
  Administered 2012-02-27: 2 mg via INTRAVENOUS
  Administered 2012-02-27: 1 mg via INTRAVENOUS
  Filled 2012-02-26 (×5): qty 1

## 2012-02-26 MED ORDER — DIPHENHYDRAMINE HCL 12.5 MG/5ML PO ELIX: 12.5000 mg | ORAL_SOLUTION | ORAL | Status: DC | PRN

## 2012-02-26 MED ORDER — POTASSIUM CHLORIDE IN NACL 20-0.9 MEQ/L-% IV SOLN
INTRAVENOUS | Status: DC
  Administered 2012-02-26 – 2012-02-28 (×3): via INTRAVENOUS
  Filled 2012-02-26 (×3): qty 1000

## 2012-02-26 MED ORDER — OXYCODONE HCL 5 MG PO TABS
5.0000 mg | ORAL_TABLET | ORAL | Status: DC | PRN
  Administered 2012-02-26 – 2012-02-27 (×8): 10 mg via ORAL
  Administered 2012-02-27 – 2012-02-28 (×3): 5 mg via ORAL
  Filled 2012-02-26 (×5): qty 2
  Filled 2012-02-26: qty 1
  Filled 2012-02-26 (×4): qty 2

## 2012-02-26 MED ORDER — MIDAZOLAM HCL 5 MG/5ML IJ SOLN
INTRAMUSCULAR | Status: DC | PRN
  Administered 2012-02-26: 2 mg via INTRAVENOUS
  Administered 2012-02-26: 1 mg via INTRAVENOUS

## 2012-02-26 MED ORDER — PHENYLEPHRINE HCL 10 MG/ML IJ SOLN
INTRAMUSCULAR | Status: DC | PRN
  Administered 2012-02-26 (×3): 80 ug via INTRAVENOUS

## 2012-02-26 MED ORDER — FENTANYL CITRATE 0.05 MG/ML IJ SOLN
INTRAMUSCULAR | Status: DC | PRN
  Administered 2012-02-26: 50 ug via INTRAVENOUS

## 2012-02-26 MED ORDER — ONDANSETRON HCL 4 MG PO TABS: 4.0000 mg | ORAL_TABLET | Freq: Four times a day (QID) | ORAL | Status: DC | PRN

## 2012-02-26 MED ORDER — LACTATED RINGERS IV SOLN
INTRAVENOUS | Status: DC
  Administered 2012-02-26: 13:00:00 via INTRAVENOUS
  Administered 2012-02-26: 1000 mL via INTRAVENOUS

## 2012-02-26 MED ORDER — ACETAMINOPHEN 650 MG RE SUPP: 650.0000 mg | Freq: Four times a day (QID) | RECTAL | Status: DC | PRN

## 2012-02-26 MED ORDER — PANTOPRAZOLE SODIUM 40 MG PO TBEC
40.0000 mg | DELAYED_RELEASE_TABLET | Freq: Two times a day (BID) | ORAL | Status: DC
  Administered 2012-02-26 – 2012-02-28 (×4): 40 mg via ORAL
  Filled 2012-02-26 (×5): qty 1

## 2012-02-26 MED ORDER — METOCLOPRAMIDE HCL 5 MG/ML IJ SOLN: 5.0000 mg | Freq: Three times a day (TID) | INTRAMUSCULAR | Status: DC | PRN

## 2012-02-26 MED ORDER — BUPIVACAINE IN DEXTROSE 0.75-8.25 % IT SOLN
INTRATHECAL | Status: DC | PRN
  Administered 2012-02-26: 2 mL via INTRATHECAL

## 2012-02-26 MED ORDER — HYDROMORPHONE HCL PF 1 MG/ML IJ SOLN
0.2500 mg | INTRAMUSCULAR | Status: DC | PRN
Start: 2012-02-26 — End: 2012-02-26

## 2012-02-26 MED ORDER — ACETAMINOPHEN 325 MG PO TABS: 650.0000 mg | ORAL_TABLET | Freq: Four times a day (QID) | ORAL | Status: DC | PRN

## 2012-02-26 MED ORDER — BUPIVACAINE ON-Q PAIN PUMP (FOR ORDER SET NO CHG)
INJECTION | Status: DC
  Filled 2012-02-26: qty 1

## 2012-02-26 SURGICAL SUPPLY — 60 items
BAG SPEC THK2 15X12 ZIP CLS (MISCELLANEOUS) ×1
BAG ZIPLOCK 12X15 (MISCELLANEOUS) ×2 IMPLANT
BANDAGE ELASTIC 6 VELCRO ST LF (GAUZE/BANDAGES/DRESSINGS) ×2 IMPLANT
BANDAGE ESMARK 6X9 LF (GAUZE/BANDAGES/DRESSINGS) ×1 IMPLANT
BLADE SAG 18X100X1.27 (BLADE) ×2 IMPLANT
BLADE SAW SGTL 11.0X1.19X90.0M (BLADE) ×2 IMPLANT
BNDG CMPR 9X6 STRL LF SNTH (GAUZE/BANDAGES/DRESSINGS) ×1
BNDG ESMARK 6X9 LF (GAUZE/BANDAGES/DRESSINGS) ×2
BOWL SMART MIX CTS (DISPOSABLE) ×2 IMPLANT
CATH KIT ON-Q SILVERSOAK 5 (CATHETERS) ×1 IMPLANT
CATH KIT ON-Q SILVERSOAK 5IN (CATHETERS) ×2 IMPLANT
CEMENT HV SMART SET (Cement) ×4 IMPLANT
CLOTH BEACON ORANGE TIMEOUT ST (SAFETY) ×2 IMPLANT
CUFF TOURN SGL QUICK 34 (TOURNIQUET CUFF) ×2
CUFF TRNQT CYL 34X4X40X1 (TOURNIQUET CUFF) ×1 IMPLANT
DRAPE EXTREMITY T 121X128X90 (DRAPE) ×2 IMPLANT
DRAPE POUCH INSTRU U-SHP 10X18 (DRAPES) ×2 IMPLANT
DRAPE U-SHAPE 47X51 STRL (DRAPES) ×2 IMPLANT
DRSG ADAPTIC 3X8 NADH LF (GAUZE/BANDAGES/DRESSINGS) ×2 IMPLANT
DRSG PAD ABDOMINAL 8X10 ST (GAUZE/BANDAGES/DRESSINGS) ×1 IMPLANT
DURAPREP 26ML APPLICATOR (WOUND CARE) ×2 IMPLANT
ELECT REM PT RETURN 9FT ADLT (ELECTROSURGICAL) ×2
ELECTRODE REM PT RTRN 9FT ADLT (ELECTROSURGICAL) ×1 IMPLANT
EVACUATOR 1/8 PVC DRAIN (DRAIN) ×2 IMPLANT
FACESHIELD LNG OPTICON STERILE (SAFETY) ×10 IMPLANT
GLOVE BIO SURGEON STRL SZ8 (GLOVE) ×2 IMPLANT
GLOVE BIOGEL PI IND STRL 7.0 (GLOVE) IMPLANT
GLOVE BIOGEL PI IND STRL 8 (GLOVE) ×2 IMPLANT
GLOVE BIOGEL PI INDICATOR 7.0 (GLOVE) ×1
GLOVE BIOGEL PI INDICATOR 8 (GLOVE) ×2
GLOVE ECLIPSE 8.0 STRL XLNG CF (GLOVE) ×2 IMPLANT
GLOVE SURG SS PI 6.5 STRL IVOR (GLOVE) ×5 IMPLANT
GOWN STRL NON-REIN LRG LVL3 (GOWN DISPOSABLE) ×4 IMPLANT
GOWN STRL REIN XL XLG (GOWN DISPOSABLE) ×2 IMPLANT
HANDPIECE INTERPULSE COAX TIP (DISPOSABLE) ×2
IMMOBILIZER KNEE 20 (SOFTGOODS) ×2
IMMOBILIZER KNEE 20 THIGH 36 (SOFTGOODS) ×1 IMPLANT
KIT BASIN OR (CUSTOM PROCEDURE TRAY) ×2 IMPLANT
MANIFOLD NEPTUNE II (INSTRUMENTS) ×2 IMPLANT
NS IRRIG 1000ML POUR BTL (IV SOLUTION) ×2 IMPLANT
PACK TOTAL JOINT (CUSTOM PROCEDURE TRAY) ×2 IMPLANT
PAD ABD 7.5X8 STRL (GAUZE/BANDAGES/DRESSINGS) ×2 IMPLANT
PADDING CAST ABS 6INX4YD NS (CAST SUPPLIES) ×1
PADDING CAST ABS COTTON 6X4 NS (CAST SUPPLIES) IMPLANT
PADDING CAST COTTON 6X4 STRL (CAST SUPPLIES) ×6 IMPLANT
POSITIONER SURGICAL ARM (MISCELLANEOUS) ×2 IMPLANT
SET HNDPC FAN SPRY TIP SCT (DISPOSABLE) ×1 IMPLANT
SPONGE GAUZE 4X4 12PLY (GAUZE/BANDAGES/DRESSINGS) ×2 IMPLANT
STRIP CLOSURE SKIN 1/2X4 (GAUZE/BANDAGES/DRESSINGS) ×3 IMPLANT
SUCTION FRAZIER 12FR DISP (SUCTIONS) ×2 IMPLANT
SUT MNCRL AB 4-0 PS2 18 (SUTURE) ×2 IMPLANT
SUT V-LOC BARB 180 2/0GR9 GS23 (SUTURE) ×2
SUT VIC AB 2-0 CT1 27 (SUTURE) ×6
SUT VIC AB 2-0 CT1 TAPERPNT 27 (SUTURE) ×3 IMPLANT
SUT VLOC 180 0 24IN GS25 (SUTURE) ×2 IMPLANT
SUTURE V-LC BRB 180 2/0GR9GS23 (SUTURE) IMPLANT
TOWEL OR 17X26 10 PK STRL BLUE (TOWEL DISPOSABLE) ×4 IMPLANT
TRAY FOLEY CATH 14FRSI W/METER (CATHETERS) ×2 IMPLANT
WATER STERILE IRR 1500ML POUR (IV SOLUTION) ×2 IMPLANT
WRAP KNEE MAXI GEL POST OP (GAUZE/BANDAGES/DRESSINGS) ×4 IMPLANT

## 2012-02-26 NOTE — Anesthesia Preprocedure Evaluation (Addendum)
Anesthesia Evaluation  Patient identified by MRN, date of birth, ID band Patient awake  General Assessment Comment:Problem list reviewed including bipolar disorder, depression, back pain, tobacco abuse, hand pain, hyperglycemia, bronchitis, gerd.  Reviewed: Allergy & Precautions, H&P , NPO status , Patient's Chart, lab work & pertinent test results  Airway Mallampati: II TM Distance: >3 FB Neck ROM: Full    Dental No notable dental hx.    Pulmonary asthma , sleep apnea ,  breath sounds clear to auscultation  Pulmonary exam normal       Cardiovascular negative cardio ROS  Rhythm:Regular Rate:Normal     Neuro/Psych  Headaches, PSYCHIATRIC DISORDERS Anxiety Depression    GI/Hepatic Neg liver ROS, GERD-  Medicated,  Endo/Other  negative endocrine ROS  Renal/GU negative Renal ROS  negative genitourinary   Musculoskeletal negative musculoskeletal ROS (+)   Abdominal (+) + obese,   Peds negative pediatric ROS (+)  Hematology negative hematology ROS (+)   Anesthesia Other Findings   Reproductive/Obstetrics negative OB ROS                           Anesthesia Physical Anesthesia Plan  ASA: III  Anesthesia Plan: Spinal   Post-op Pain Management:    Induction: Intravenous  Airway Management Planned:   Additional Equipment:   Intra-op Plan:   Post-operative Plan: Extubation in OR  Informed Consent: I have reviewed the patients History and Physical, chart, labs and discussed the procedure including the risks, benefits and alternatives for the proposed anesthesia with the patient or authorized representative who has indicated his/her understanding and acceptance.   Dental advisory given  Plan Discussed with: CRNA  Anesthesia Plan Comments: (Discussed risks/benefits of spinal including headache, backache, failure, bleeding, infection, and nerve damage. Patient consents to spinal. Questions  answered. Coagulation studies and platelet count acceptable. )       Anesthesia Quick Evaluation

## 2012-02-26 NOTE — Op Note (Signed)
Pre-operative diagnosis- Osteoarthritis  Right knee(s)  Post-operative diagnosis- Osteoarthritis Right knee(s)  Procedure-  Right  Total Knee Arthroplasty  Surgeon- Gus Rankin. Rebakah Cokley, MD  Assistant- Leilani Able, PA-C   Anesthesia-  Spinal EBL-* No blood loss amount entered *  Drains Hemovac  Tourniquet time-  Total Tourniquet Time Documented: Thigh (Right) - 36 minutes   Complications- None  Condition-PACU - hemodynamically stable.   Brief Clinical Note  [name] is a 41 y.o. year old female with end stage OA of her right knee with progressively worsening pain and dysfunction. She has constant pain, with activity and at rest and significant functional deficits with difficulties even with ADLs. She has had extensive non-op management including analgesics, injections of cortisone and viscosupplements, and home exercise program, but remains in significant pain with significant dysfunction.Radiographs show bone on bone arthritis patellofemoral with medial joint space narrowing. She presents now for right Total Knee Arthroplasty.    Procedure in detail---   The patient is brought into the operating room and positioned supine on the operating table. After successful administration of  Spinal,   a tourniquet is placed high on the  Right thigh(s) and the lower extremity is prepped and draped in the usual sterile fashion. Time out is performed by the operating team and then the  Right lower extremity is wrapped in Esmarch, knee flexed and the tourniquet inflated to 300 mmHg.       A midline incision is made with a ten blade through the subcutaneous tissue to the level of the extensor mechanism. A fresh blade is used to make a medial parapatellar arthrotomy. Soft tissue over the proximal medial tibia is subperiosteally elevated to the joint line with a knife and into the semimembranosus bursa with a Cobb elevator. Soft tissue over the proximal lateral tibia is elevated with attention being  paid to avoiding the patellar tendon on the tibial tubercle. The patella is everted, knee flexed 90 degrees and the ACL and PCL are removed. Findings are bone on bone patellofemoral with exposed bone medial femoral condyle and large global osteophytes.        The drill is used to create a starting hole in the distal femur and the canal is thoroughly irrigated with sterile saline to remove the fatty contents. The 5 degree Right  valgus alignment guide is placed into the femoral canal and the distal femoral cutting block is pinned to remove 10 mm off the distal femur. Resection is made with an oscillating saw.      The tibia is subluxed forward and the menisci are removed. The extramedullary alignment guide is placed referencing proximally at the medial aspect of the tibial tubercle and distally along the second metatarsal axis and tibial crest. The block is pinned to remove 2mm off the more deficient medial  side. Resection is made with an oscillating saw. Size 2.5is the most appropriate size for the tibia and the proximal tibia is prepared with the modular drill and keel punch for that size.      The femoral sizing guide is placed and size 3 is most appropriate. Rotation is marked off the epicondylar axis and confirmed by creating a rectangular flexion gap at 90 degrees. The size 3 cutting block is pinned in this rotation and the anterior, posterior and chamfer cuts are made with the oscillating saw. The intercondylar block is then placed and that cut is made.      Trial size 2.5 tibial component, trial size 3 posterior  stabilized femur and a 10  mm posterior stabilized rotating platform insert trial is placed. Full extension is achieved with excellent varus/valgus and anterior/posterior balance throughout full range of motion. The patella is everted and thickness measured to be 20  mm. Free hand resection is taken to 12 mm, a 32 template is placed, lug holes are drilled, trial patella is placed, and it tracks  normally. Osteophytes are removed off the posterior femur with the trial in place. All trials are removed and the cut bone surfaces prepared with pulsatile lavage. Cement is mixed and once ready for implantation, the size 2.5 tibial implant, size  3 posterior stabilized femoral component, and the size 32 patella are cemented in place and the patella is held with the clamp. The trial insert is placed and the knee held in full extension. All extruded cement is removed and once the cement is hard the permanent 10 mm posterior stabilized rotating platform insert is placed into the tibial tray.      The wound is copiously irrigated with saline solution and the extensor mechanism closed over a hemovac drain with #1 PDS suture. The tourniquet is released for a total tourniquet time of 36  minutes. Flexion against gravity is 140 degrees and the patella tracks normally. Subcutaneous tissue is closed with 2.0 vicryl and subcuticular with running 4.0 Monocryl. The catheter for the Marcaine pain pump is placed and the pump is initiated. The incision is cleaned and dried and steri-strips and a bulky sterile dressing are applied. The limb is placed into a knee immobilizer and the patient is awakened and transported to recovery in stable condition.      Please note that a surgical assistant was a medical necessity for this procedure in order to perform it in a safe and expeditious manner. Surgical assistant was necessary to retract the ligaments and vital neurovascular structures to prevent injury to them and also necessary for proper positioning of the limb to allow for anatomic placement of the prosthesis.   Gus Rankin Yaniv Lage, MD    02/26/2012, 11:55 AM

## 2012-02-26 NOTE — H&P (View-Only) (Signed)
[name]  DOB: 04/30/1971 Married / Language: English / Race: White Female  Date of Admission:  02/26/2012  Chief Complaint:  Right Knee Pain  History of Present Illness The patient is a 41 year old female who comes in for a preoperative History and Physical. The patient is scheduled for a right total knee arthroplasty to be performed by Dr. Frank V. Aluisio, MD at [facility] on 02/26/2012. She has had problems with her right knee dating back to her teenage years. She injured herself at age 14. She had pain and swelling with that initial injury. She has never had surgery on the right knee. Over the past few years she has had multiple episodes where the knee has locked up on her. She has a constant baseline level of pain but then gets these episodes and has a marked increase in pain. Generally it takes several weeks until she is back to the baseline level. Unfortunately these are occurring more frequently. They are also more painful when they are occurring. She has had multiple cortisone injections in the past. She has had visco supplement injections. She wanted to discuss more definitive treatment for her knee. She continues to work but has a hard time because of these frequent episodes of the severe pain. Dr. Beakmon just recently did aspiration and cortisone injection and despite that she has had continued problems. She is now ready to procedd with surgery. They have been treated conservatively in the past for the above stated problem and despite conservative measures, they continue to have progressive pain and severe functional limitations and dysfunction. They have failed non-operative management including home exercise, medications, and injections. It is felt that they would benefit from undergoing total joint replacement. Risks and benefits of the procedure have been discussed with the patient and they elect to proceed with surgery. There are no active contraindications  to surgery such as ongoing infection or rapidly progressive neurological disease.   Problem List/Past Medical Osteoarthritis, Knee (715.96)   Allergies Penicillins. Itching, Rash.   Family History Cerebrovascular Accident. mother and grandmother mothers side Diabetes Mellitus. mother and grandmother mothers side Osteoarthritis. mother and grandmother mothers side Drug / Alcohol Addiction. father and grandfather fathers side Hypertension. mother and grandmother mothers side   Social History Alcohol use. current drinker; drinks hard liquor; only occasionally per week Children. 1 Current work status. working full time Drug/Alcohol Rehab (Currently). no Exercise. Exercises rarely; does running / walking Illicit drug use. no Living situation. live with spouse Marital status. 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 1 Previously in rehab. no Tobacco / smoke exposure. no Pain Contract. no Tobacco use. former smoker; smoke(d) 3 or more pack(s) per day Post-Surgical Plans. Plan is to go home.   Pregnancy / Birth History Pregnant. no   Past Surgical History No pertinent past surgical history  Medical History Depression Osteoarthritis Psychiatric disorder. Bipolar (treated by Dr. Syed Arfeen [phone] - [facility]) Gastroesophageal Reflux Disease Sleep Apnea   Review of Systems General:Not Present- Chills, Fever, Night Sweats, Fatigue, Weight Gain, Weight Loss and Memory Loss. Skin:Not Present- Hives, Itching, Rash, Eczema and Lesions. HEENT:Not Present- Tinnitus, Headache, Double Vision, Visual Loss, Hearing Loss and Dentures. Respiratory:Not Present- Shortness of breath with exertion, Shortness of breath at rest, Allergies, Coughing up blood and Chronic Cough. Cardiovascular:Not Present- Chest Pain, Racing/skipping heartbeats, Difficulty Breathing  Lying Down, Murmur, Swelling and Palpitations. Gastrointestinal:Not Present- Bloody Stool, Heartburn, Abdominal Pain, Vomiting,   Nausea, Constipation, Diarrhea, Difficulty Swallowing, Jaundice and Loss of appetitie. Female Genitourinary:Not Present- Blood in Urine, Urinary frequency, Weak urinary stream, Discharge, Flank Pain, Incontinence, Painful Urination, Urgency, Urinary Retention and Urinating at Night. Musculoskeletal:Present- Joint Pain. Not Present- Muscle Weakness, Muscle Pain, Joint Swelling, Back Pain, Morning Stiffness and Spasms. Neurological:Not Present- Tremor, Dizziness, Blackout spells, Paralysis, Difficulty with balance and Weakness. Psychiatric:Not Present- Insomnia.   Vitals Weight: 220 lb Height: 67.5 in Weight was reported by patient. Height was reported by patient. Body Surface Area: 2.18 m Body Mass Index: 33.95 kg/m Pulse: 84 (Regular) Resp.: 16 (Unlabored) BP: 112/76 (Sitting, Left Arm, Standard)   Physical Exam The physical exam findings are as follows:  Patient is a 41 year old female with continued knee pain.   General Mental Status - Alert, cooperative and good historian. General Appearance- pleasant. Not in acute distress. Orientation- Oriented X3. Build & Nutrition- Well nourished and Well developed.   Head and Neck Head- normocephalic, atraumatic . Neck Global Assessment- supple. no bruit auscultated on the right and no bruit auscultated on the left.   Eye Pupil- Bilateral- Regular and Round. Note: wears glasses Motion- Bilateral- EOMI.   ENMT upper partial denture plate  Chest and Lung Exam Auscultation: Breath sounds:- clear at anterior chest wall and - clear at posterior chest wall. Adventitious sounds:- No Adventitious sounds.   Cardiovascular Auscultation:Rhythm- Regular rate and rhythm. Heart Sounds- S1 WNL and S2 WNL. Murmurs & Other Heart Sounds:Auscultation of the heart reveals - No  Murmurs.   Abdomen Inspection:Contour- Generalized mild distention. Palpation/Percussion:Tenderness- Abdomen is non-tender to palpation. Rigidity (guarding)- Abdomen is soft. Auscultation:Auscultation of the abdomen reveals - Bowel sounds normal.   Female Genitourinary  Not done, not pertinent to present illness  Musculoskeletal  On exam very pleasant, well developed female alert and oriented in no apparent distress. Both hips show normal range of motion with no discomfort. Left knee shows no effusion. Range 0 to 135, slight crepitus on range of motion. No jointline tenderness or instability. Right knee no deformity. Range about 5 to 125, marked crepitus on range of motion. Tender medial and lateral with no instability noted. Pulses, sensation and motor are intact both lower extremities. Gait pattern is significantly antalgic on the right  RADIOGRAPHS: AP both knees and lateral of the right show that she has severe patellofemoral arthritis bone on bone with some erosion of the inferior aspect of the patella. In addition, she has significant squaring off of the condyles medially and laterally. She still has decent joint space left medial and lateral but has squaring of the condyles and large marginal osteophytes.  Assessment & Plan Osteoarthritis, Knee (715.96) Impression: Right Knee  Note: Patient is for a right total knee replacement by Dr. Aluisio.  Plan is to go home.  Please note, that the patient had an episode of an suicide attempt after starting klonopin (2005).  PCP - Dr. Beekman  Psych - Dr. Arfeen [facility]  Signed electronically by DREW L PERKINS, PA-C 

## 2012-02-26 NOTE — Anesthesia Procedure Notes (Signed)
Spinal  Patient location during procedure: OR Staffing Anesthesiologist: Azell Der Performed by: anesthesiologist  Preanesthetic Checklist Completed: patient identified, site marked, surgical consent, pre-op evaluation, timeout performed, IV checked, risks and benefits discussed and monitors and equipment checked Spinal Block Patient position: sitting Prep: Betadine Patient monitoring: heart rate, cardiac monitor, continuous pulse ox and blood pressure Location: L3-4 Injection technique: single-shot Needle Needle type: Sprotte  Needle gauge: 24 G Additional Notes Clear CSF obtained. No paresthesia. Tolerated well. Adequate level.

## 2012-02-26 NOTE — Interval H&P Note (Signed)
History and Physical Interval Note:  02/26/2012 9:54 AM  [name]  has presented today for surgery, with the diagnosis of osteoarthritis right knee  The various methods of treatment have been discussed with the patient and family. After consideration of risks, benefits and other options for treatment, the patient has consented to  Procedure(s) (LRB) with comments: TOTAL KNEE ARTHROPLASTY (Right) as a surgical intervention .  The patient's history has been reviewed, patient examined, no change in status, stable for surgery.  I have reviewed the patient's chart and labs.  Questions were answered to the patient's satisfaction.     Loanne Drilling

## 2012-02-26 NOTE — Anesthesia Postprocedure Evaluation (Signed)
  Anesthesia Post-op Note  Patient: [name]  Procedure(s) Performed: Procedure(s) (LRB): TOTAL KNEE ARTHROPLASTY (Right)  Patient Location: PACU  Anesthesia Type: Spinal  Level of Consciousness: awake and alert   Airway and Oxygen Therapy: Patient Spontanous Breathing  Post-op Pain: mild  Post-op Assessment: Post-op Vital signs reviewed, Patient's Cardiovascular Status Stable, Respiratory Function Stable, Patent Airway and No signs of Nausea or vomiting  Post-op Vital Signs: stable  Complications: No apparent anesthesia complications. Spinal has regressed greater than 3 levels. No back pain.

## 2012-02-26 NOTE — Transfer of Care (Signed)
Immediate Anesthesia Transfer of Care Note  Patient: [name]  Procedure(s) Performed: Procedure(s) (LRB) with comments: TOTAL KNEE ARTHROPLASTY (Right)  Patient Location: PACU  Anesthesia Type: Spinal  Level of Consciousness: awake, alert , oriented and patient cooperative  Airway & Oxygen Therapy: Patient Spontanous Breathing and Patient connected to face mask oxygen  Post-op Assessment: Report given to PACU RN and Post -op Vital signs reviewed and stable  Post vital signs: stable  Complications: No apparent anesthesia complications  T 11 spinal level

## 2012-02-27 ENCOUNTER — Encounter (HOSPITAL_COMMUNITY): Payer: Self-pay | Admitting: Orthopedic Surgery

## 2012-02-27 LAB — CBC
Hemoglobin: 11.1 g/dL — ABNORMAL LOW (ref 12.0–15.0)
MCV: 93.3 fL (ref 78.0–100.0)
Platelets: 413 10*3/uL — ABNORMAL HIGH (ref 150–400)
RBC: 3.58 MIL/uL — ABNORMAL LOW (ref 3.87–5.11)
WBC: 12.1 10*3/uL — ABNORMAL HIGH (ref 4.0–10.5)

## 2012-02-27 LAB — GLUCOSE, CAPILLARY: Glucose-Capillary: 124 mg/dL — ABNORMAL HIGH (ref 70–99)

## 2012-02-27 LAB — BASIC METABOLIC PANEL
CO2: 24 mEq/L (ref 19–32)
Chloride: 102 mEq/L (ref 96–112)
Creatinine, Ser: 0.69 mg/dL (ref 0.50–1.10)

## 2012-02-27 NOTE — Progress Notes (Signed)
Physical Therapy Treatment Patient Details Name: [name] MRN: [id number] DOB: Feb 11, 1971 Today's Date: 02/27/2012 Time: 4098-1191 PT Time Calculation (min): 28 min  PT Assessment / Plan / Recommendation Comments on Treatment Session  On backing up to bed, pt attempting to reach for bed while still too far from edge.  Pt cued to use RW to back up to bed until Bil LEs against.  Upon Bil LEs contacting bed, pt attempts to sit without advancing R LE.  Halfway to sitting pt experiences R knee pain and brings self upright.  Pt cued for proper technique and perfomed task with min assist.  Pt states pain is 9/10,  Positioned on bed, ice packs provided, RN aware and providing MEDs.      Follow Up Recommendations  Home health PT    Barriers to Discharge        Equipment Recommendations  Rolling walker with 5" wheels    Recommendations for Other Services OT consult  Frequency 7X/week   Plan Discharge plan remains appropriate    Precautions / Restrictions Precautions Precautions: Knee Required Braces or Orthoses: Knee Immobilizer - Right Knee Immobilizer - Right: Discontinue once straight leg raise with < 10 degree lag Restrictions Weight Bearing Restrictions: No Other Position/Activity Restrictions: WBAT   Pertinent Vitals/Pain 5/10 after ambulating, 9/10 after attempt to sit without advancing R LE.  Pt premedicated, ice packs provided, RN providing muscle relaxor as soon as available from Pharm.    Mobility  Bed Mobility Bed Mobility: Sit to Supine Sit to Supine: 4: Min assist Details for Bed Mobility Assistance: assist with R LE Transfers Transfers: Sit to Stand;Stand to Sit Sit to Stand: 4: Min guard;From chair/3-in-1;With armrests Stand to Sit: 4: Min assist Details for Transfer Assistance: cues for R LE fwd and use of UEs to self assist Ambulation/Gait Ambulation/Gait Assistance: 4: Min guard Ambulation Distance (Feet): 150 Feet (and 20') Assistive device: Rolling  walker Ambulation/Gait Assistance Details: cues for posture, stride length and position from RW Gait Pattern: Step-to pattern;Step-through pattern    Exercises     PT Diagnosis:    PT Problem List:   PT Treatment Interventions:     PT Goals Acute Rehab PT Goals PT Goal Formulation: With patient Time For Goal Achievement: 03/03/12 Potential to Achieve Goals: Good Pt will go Supine/Side to Sit: with supervision PT Goal: Supine/Side to Sit - Progress: Goal set today Pt will go Sit to Supine/Side: with supervision PT Goal: Sit to Supine/Side - Progress: Goal set today Pt will go Sit to Stand: with supervision PT Goal: Sit to Stand - Progress: Progressing toward goal Pt will go Stand to Sit: with supervision PT Goal: Stand to Sit - Progress: Progressing toward goal Pt will Ambulate: >150 feet;with supervision;with rolling walker PT Goal: Ambulate - Progress: Progressing toward goal Pt will Go Up / Down Stairs: 1-2 stairs;with min assist;with least restrictive assistive device PT Goal: Up/Down Stairs - Progress: Goal set today  Visit Information  Last PT Received On: 02/27/12 Assistance Needed: +1    Subjective Data  Subjective: I'm doing better and I got my pain medicine about 30 min ago Patient Stated Goal: Resume previous lifestyle with decreased pain   Cognition  Overall Cognitive Status: Appears within functional limits for tasks assessed/performed Arousal/Alertness: Awake/alert Orientation Level: Appears intact for tasks assessed Behavior During Session: [facility] for tasks performed    Balance     End of Session PT - End of Session Equipment Utilized During Treatment: Right knee immobilizer  Activity Tolerance: Patient tolerated treatment well Patient left: in bed;with call bell/phone within reach;with family/visitor present Nurse Communication: Mobility status;Patient requests pain meds   GP     Sylena Lotter 02/27/2012, 3:45 PM

## 2012-02-27 NOTE — Evaluation (Signed)
Occupational Therapy Evaluation Patient Details Name: [name] MRN: [id number] DOB: 1970-11-12 Today's Date: 02/27/2012 Time: 2130-8657 OT Time Calculation (min): 20 min  OT Assessment / Plan / Recommendation Clinical Impression  This 41 year old female was admitted for R TKA.  All education was completed and pt plans to borrow bathroom DME.  No further OT is needed at this time.      OT Assessment  Patient does not need any further OT services    Follow Up Recommendations  No OT follow up    Barriers to Discharge      Equipment Recommendations  Rolling walker with 5" wheels (plans to borrow 3:1 and tub transfer bench)    Recommendations for Other Services    Frequency       Precautions / Restrictions Precautions Precautions: Knee Required Braces or Orthoses: Knee Immobilizer - Right Knee Immobilizer - Right: Discontinue once straight leg raise with < 10 degree lag Restrictions Weight Bearing Restrictions: No Other Position/Activity Restrictions: WBAT   Pertinent Vitals/Pain 5/10 R knee; repositioned and pain decreased    ADL  Grooming: Performed;Supervision/safety Where Assessed - Grooming: Supported standing Toilet Transfer: Performed;Min guard Statistician Method: Sit to Barista: Raised toilet seat with arms (or 3-in-1 over toilet) Toileting - Clothing Manipulation and Hygiene: Performed;Supervision/safety Where Assessed - Toileting Clothing Manipulation and Hygiene: Standing Transfers/Ambulation Related to ADLs: min guard to walk to bathroom.  Pt sequenced correctly without need for cues, other than for sit to stand to extend leg ADL Comments: Pt needs min A overall for LB adls and set up for UB.  Educated on AE, if she wants this.    OT Diagnosis:    OT Problem List:   OT Treatment Interventions:     OT Goals    Visit Information  Last OT Received On: 02/27/12 Assistance Needed: +1    Subjective Data  Subjective: I  called early.  I have to go to the bathroom Patient Stated Goal: home with husband's assistance   Prior Functioning     Home Living Lives With: Spouse Available Help at Discharge: Family Type of Home: House Home Access: Stairs to enter Secretary/administrator of Steps: 1 Entrance Stairs-Rails: None Home Layout: Able to live on main level with bedroom/bathroom Bathroom Shower/Tub: Engineer, manufacturing systems: Standard Additional Comments: plans to borrow 3:1 and tub transfer bendh Prior Function Level of Independence: Independent;Independent with assistive device(s) Able to Take Stairs?: Yes Driving: Yes Vocation: Full time employment Communication Communication: No difficulties Dominant Hand: Right         Vision/Perception     Cognition  Overall Cognitive Status: Appears within functional limits for tasks assessed/performed Arousal/Alertness: Awake/alert Orientation Level: Appears intact for tasks assessed Behavior During Session: [facility] for tasks performed    Extremity/Trunk Assessment Right Upper Extremity Assessment RUE ROM/Strength/Tone: [facility] for tasks assessed Left Upper Extremity Assessment LUE ROM/Strength/Tone: WFL for tasks assessed Right Lower Extremity Assessment RLE ROM/Strength/Tone: Deficits RLE ROM/Strength/Tone Deficits: -10 - 40 AAROM at knee with 2/5 quad strength Left Lower Extremity Assessment LLE ROM/Strength/Tone: WFL for tasks assessed     Mobility  Transfers Sit to Stand: 4: Min guard;From chair/3-in-1;With armrests Stand to Sit: 4: Min assist Details for Transfer Assistance: cues for LE position     Shoulder Instructions     Exercise    Balance     End of Session OT - End of Session Equipment Utilized During Treatment: Right knee immobilizer Activity Tolerance: Patient tolerated treatment  well Patient left: in chair;with call bell/phone within reach;with family/visitor present  GO     [name] 02/27/2012, 2:34  PM Marica Otter, OTR/L [phone] 02/27/2012

## 2012-02-27 NOTE — Progress Notes (Signed)
   Subjective: 1 Day Post-Op Procedure(s) (LRB): TOTAL KNEE ARTHROPLASTY (Right) Patient reports pain as mild.   Patient seen in rounds by Dr. Lequita Halt. Patient is well, and has had no acute complaints or problems We will start therapy today.  Plan is to go Home after hospital stay.  Objective: Vital signs in last 24 hours: Temp:  [97.6 F (36.4 C)-98.7 F (37.1 C)] 97.8 F (36.6 C) (10/03 0411) Pulse Rate:  [51-87] 87  (10/03 0411) Resp:  [11-20] 16  (10/03 0411) BP: (91-119)/(56-77) 115/72 mmHg (10/03 0411) SpO2:  [95 %-100 %] 100 % (10/03 0411) Weight:  [102.967 kg (227 lb)] 102.967 kg (227 lb) (10/02 1330)  Intake/Output from previous day:  Intake/Output Summary (Last 24 hours) at 02/27/12 0748 Last data filed at 02/27/12 0600  Gross per 24 hour  Intake 3368.25 ml  Output   3265 ml  Net 103.25 ml    Intake/Output this shift: UOP 975 since MN  Labs:  Basename 02/27/12 0403  HGB 11.1*    Basename 02/27/12 0403  WBC 12.1*  RBC 3.58*  HCT 33.4*  PLT 413*    Basename 02/27/12 0403  NA 135  K 3.5  CL 102  CO2 24  BUN 8  CREATININE 0.69  GLUCOSE 122*  CALCIUM 8.4   No results found for this basename: LABPT:2,INR:2 in the last 72 hours  EXAM General - Patient is Alert, Appropriate and Oriented Extremity - Neurovascular intact Sensation intact distally Dorsiflexion/Plantar flexion intact Dressing - dressing C/D/I Motor Function - intact, moving foot and toes well on exam.  Hemovac pulled without difficulty.  Past Medical History  Diagnosis Date  . Arthritis   . GERD (gastroesophageal reflux disease)   . PCO (polycystic ovaries)   . Stress incontinence, female   . Headache     migraines-  "stress and hormonal"  . Mental disorder     bipolar/ LOV Dr Lolly Mustache  LOV 4/13 EPIC  . Depression   . Sleep apnea     no CPAP after 2007 after 150 lbs weight loss    Assessment/Plan: 1 Day Post-Op Procedure(s) (LRB): TOTAL KNEE ARTHROPLASTY  (Right) Principal Problem:  *OA (osteoarthritis) of knee   Advance diet Up with therapy Discharge home with home health  DVT Prophylaxis - Xarelto Weight-Bearing as tolerated to right leg No vaccines. D/C O2 and Pulse OX and try on Room [address]  [name] 02/27/2012, 7:48 AM

## 2012-02-27 NOTE — Evaluation (Signed)
Physical Therapy Evaluation Patient Details Name: [name] MRN: [id number] DOB: Dec 09, 1970 Today's Date: 02/27/2012 Time: 1130-1200 PT Time Calculation (min): 30 min  PT Assessment / Plan / Recommendation Clinical Impression  Pt s/p R TKR presents with decreased R LE strength/ROM limiting functional mobility    PT Assessment  Patient needs continued PT services    Follow Up Recommendations  Home health PT    Barriers to Discharge        Equipment Recommendations  Rolling walker with 5" wheels    Recommendations for Other Services OT consult   Frequency 7X/week    Precautions / Restrictions Precautions Precautions: Knee Required Braces or Orthoses: Knee Immobilizer - Right Knee Immobilizer - Right: Discontinue once straight leg raise with < 10 degree lag Restrictions Weight Bearing Restrictions: No Other Position/Activity Restrictions: WBAT   Pertinent Vitals/Pain      Mobility  Bed Mobility Bed Mobility: Supine to Sit Supine to Sit: 4: Min assist Details for Bed Mobility Assistance: cues for sequence and use of L LE to self assist Transfers Transfers: Sit to Stand;Stand to Sit Sit to Stand: 4: Min assist Stand to Sit: 4: Min assist Details for Transfer Assistance: cues for use of UEs and for LE management Ambulation/Gait Ambulation/Gait Assistance: 4: Min Environmental consultant (Feet): 34 Feet Assistive device: Rolling walker Ambulation/Gait Assistance Details: cues for sequence, posture, and position from RW Gait Pattern: Step-to pattern    Shoulder Instructions     Exercises Total Joint Exercises Ankle Circles/Pumps: AROM;10 reps;Both;Supine Quad Sets: AROM;Both;10 reps;Supine Heel Slides: AAROM;10 reps;Right;Supine Straight Leg Raises: AAROM;10 reps;Supine;Right   PT Diagnosis: Difficulty walking  PT Problem List: Decreased strength;Decreased range of motion;Decreased activity tolerance;Decreased mobility;Decreased knowledge of use of  DME;Pain;Obesity PT Treatment Interventions: DME instruction;Gait training;Stair training;Functional mobility training;Therapeutic activities;Therapeutic exercise;Patient/family education   PT Goals Acute Rehab PT Goals PT Goal Formulation: With patient Time For Goal Achievement: 03/03/12 Potential to Achieve Goals: Good Pt will go Supine/Side to Sit: with supervision PT Goal: Supine/Side to Sit - Progress: Goal set today Pt will go Sit to Supine/Side: with supervision PT Goal: Sit to Supine/Side - Progress: Goal set today Pt will go Sit to Stand: with supervision PT Goal: Sit to Stand - Progress: Goal set today Pt will go Stand to Sit: with supervision PT Goal: Stand to Sit - Progress: Goal set today Pt will Ambulate: >150 feet;with supervision;with rolling walker PT Goal: Ambulate - Progress: Goal set today Pt will Go Up / Down Stairs: 1-2 stairs;with min assist;with least restrictive assistive device PT Goal: Up/Down Stairs - Progress: Goal set today  Visit Information  Last PT Received On: 02/27/12 Assistance Needed: +1    Subjective Data  Subjective: I've had trouble with this knee for 30 yrs Patient Stated Goal: Resume previous lifestyle with decreased pain   Prior Functioning  Home Living Lives With: Spouse Available Help at Discharge: Family Type of Home: House Home Access: Stairs to enter Secretary/administrator of Steps: 1 Entrance Stairs-Rails: None Home Layout: Able to live on main level with bedroom/bathroom Prior Function Level of Independence: Independent;Independent with assistive device(s) Able to Take Stairs?: Yes Driving: Yes Vocation: Full time employment Communication Communication: No difficulties Dominant Hand: Right    Cognition  Overall Cognitive Status: Appears within functional limits for tasks assessed/performed Arousal/Alertness: Awake/alert Orientation Level: Appears intact for tasks assessed Behavior During Session: [facility] for tasks  performed    Extremity/Trunk Assessment Right Upper Extremity Assessment RUE ROM/Strength/Tone: [facility] for tasks assessed Left Upper  Extremity Assessment LUE ROM/Strength/Tone: WFL for tasks assessed Right Lower Extremity Assessment RLE ROM/Strength/Tone: Deficits RLE ROM/Strength/Tone Deficits: -10 - 40 AAROM at knee with 2/5 quad strength Left Lower Extremity Assessment LLE ROM/Strength/Tone: WFL for tasks assessed   Balance    End of Session PT - End of Session Equipment Utilized During Treatment: Right knee immobilizer Activity Tolerance: Patient tolerated treatment well Patient left: in chair;with call bell/phone within reach;with family/visitor present Nurse Communication: Mobility status  GP     [name] 02/27/2012, 1:02 PM

## 2012-02-27 NOTE — Care Management Note (Unsigned)
    Page 1 of 2   02/27/2012     2:17:42 PM   CARE MANAGEMENT NOTE 02/27/2012  Patient:  [name], [name]   Account Number:  [id number]  Date Initiated:  02/27/2012  Documentation initiated by:  Colleen Can  Subjective/Objective Assessment:   dx total knee replacemnt -right;     Action/Plan:   CM  spoke with patient. Plans are for patient to return to her home in [place], [place] where spouse will be caregiver. She wants to use [facility] agency that is in network. SHE WILL NEED DME-RW  OT  EVAL  IS PENDING   Anticipated DC Date:  02/29/2012   Anticipated DC Plan:  HOME W HOME HEALTH SERVICES  In-house referral  NA      DC Planning Services  CM consult      [facility] Choice  HOME HEALTH  DURABLE MEDICAL EQUIPMENT   Choice offered to / List presented to:  C-1 Patient        HH arranged  HH-2 PT      [facility] agency  [facility]   Status of service:  In process, will continue to follow Medicare Important Message given?   (If response is "NO", the following Medicare IM given date fields will be blank) Date Medicare IM given:   Date Additional Medicare IM given:    Discharge Disposition:    Per UR Regulation:    If discussed at Long Length of Stay Meetings, dates discussed:    Comments:  02/27/2012 Raynelle Bring BSN CCM [phone] Genevieve Norlander can not service [name] currently [facility] does not service area . Spoke with [facility] co-ordinator who can service patient with HHpt. CM will follow for needs.

## 2012-02-28 DIAGNOSIS — D500 Iron deficiency anemia secondary to blood loss (chronic): Secondary | ICD-10-CM

## 2012-02-28 DIAGNOSIS — E876 Hypokalemia: Secondary | ICD-10-CM

## 2012-02-28 LAB — BASIC METABOLIC PANEL
CO2: 25 mEq/L (ref 19–32)
Chloride: 104 mEq/L (ref 96–112)
Glucose, Bld: 123 mg/dL — ABNORMAL HIGH (ref 70–99)
Potassium: 3.4 mEq/L — ABNORMAL LOW (ref 3.5–5.1)
Sodium: 137 mEq/L (ref 135–145)

## 2012-02-28 LAB — CBC
Hemoglobin: 10.5 g/dL — ABNORMAL LOW (ref 12.0–15.0)
Platelets: 370 10*3/uL (ref 150–400)
RBC: 3.35 MIL/uL — ABNORMAL LOW (ref 3.87–5.11)
WBC: 9.6 10*3/uL (ref 4.0–10.5)

## 2012-02-28 MED ORDER — OXYCODONE HCL 5 MG PO TABS: 5.0000 mg | ORAL_TABLET | ORAL | Status: DC | PRN

## 2012-02-28 MED ORDER — POTASSIUM CHLORIDE CRYS ER 20 MEQ PO TBCR
40.0000 meq | EXTENDED_RELEASE_TABLET | Freq: Every day | ORAL | Status: DC
  Administered 2012-02-28: 40 meq via ORAL
  Filled 2012-02-28: qty 2

## 2012-02-28 MED ORDER — RIVAROXABAN 10 MG PO TABS: 10.0000 mg | ORAL_TABLET | Freq: Every day | ORAL | Status: DC

## 2012-02-28 MED ORDER — METHOCARBAMOL 500 MG PO TABS: 500.0000 mg | ORAL_TABLET | Freq: Four times a day (QID) | ORAL | Status: DC | PRN

## 2012-02-28 NOTE — Discharge Summary (Signed)
Physician Discharge Summary   Patient ID: [name] MRN: [id number] DOB/AGE: Feb 08, 1971 41 y.o.  Admit date: 02/26/2012 Discharge date: 02/28/2012  Primary Diagnosis: Osteoarthritis Right knee   Admission Diagnoses:  Past Medical History  Diagnosis Date  . Arthritis   . GERD (gastroesophageal reflux disease)   . PCO (polycystic ovaries)   . Stress incontinence, female   . Headache     migraines-  "stress and hormonal"  . Mental disorder     bipolar/ LOV Dr Lolly Mustache  LOV 4/13 EPIC  . Depression   . Sleep apnea     no CPAP after 2007 after 150 lbs weight loss   Discharge Diagnoses:   Principal Problem:  *OA (osteoarthritis) of knee Active Problems:  Expected Postop Blood loss anemia  Postop Hypokalemia  Procedure:  Procedure(s) (LRB): TOTAL KNEE ARTHROPLASTY (Right)   Consults: None  HPI: [name] is a 41 y.o. year old female with end stage OA of her right knee with progressively worsening pain and dysfunction. She has constant pain, with activity and at rest and significant functional deficits with difficulties even with ADLs. She has had extensive non-op management including analgesics, injections of cortisone and viscosupplements, and home exercise program, but remains in significant pain with significant dysfunction.Radiographs show bone on bone arthritis patellofemoral with medial joint space narrowing. She presents now for right Total Knee Arthroplasty.   Laboratory Data: Hospital Outpatient Visit on 02/20/2012  Component Date Value Range Status  . aPTT 02/20/2012 28  24 - 37 seconds Final  . WBC 02/20/2012 8.6  4.0 - 10.5 K/uL Final  . RBC 02/20/2012 4.08  3.87 - 5.11 MIL/uL Final  . Hemoglobin 02/20/2012 12.8  12.0 - 15.0 g/dL Final  . HCT 29/52/8413 38.3  36.0 - 46.0 % Final  . MCV 02/20/2012 93.9  78.0 - 100.0 fL Final  . MCH 02/20/2012 31.4  26.0 - 34.0 pg Final  . MCHC 02/20/2012 33.4  30.0 - 36.0 g/dL Final  . RDW 24/40/1027 11.9  11.5 - 15.5  % Final  . Platelets 02/20/2012 435* 150 - 400 K/uL Final  . Sodium 02/20/2012 135  135 - 145 mEq/L Final  . Potassium 02/20/2012 3.7  3.5 - 5.1 mEq/L Final  . Chloride 02/20/2012 103  96 - 112 mEq/L Final  . CO2 02/20/2012 24  19 - 32 mEq/L Final  . Glucose, Bld 02/20/2012 102* 70 - 99 mg/dL Final  . BUN 25/36/6440 8  6 - 23 mg/dL Final  . Creatinine, Ser 02/20/2012 0.88  0.50 - 1.10 mg/dL Final  . Calcium 34/74/2595 9.2  8.4 - 10.5 mg/dL Final  . Total Protein 02/20/2012 6.5  6.0 - 8.3 g/dL Final  . Albumin 63/87/5643 3.3* 3.5 - 5.2 g/dL Final  . AST 32/95/1884 11  0 - 37 U/L Final  . ALT 02/20/2012 15  0 - 35 U/L Final  . Alkaline Phosphatase 02/20/2012 59  39 - 117 U/L Final  . Total Bilirubin 02/20/2012 0.2* 0.3 - 1.2 mg/dL Final  . GFR calc non Af Amer 02/20/2012 80* >90 mL/min Final  . GFR calc Af Amer 02/20/2012 >90  >90 mL/min Final   Comment:                                 The eGFR has been calculated  using the CKD EPI equation.                          This calculation has not been                          validated in all clinical                          situations.                          eGFR's persistently                          <90 mL/min signify                          possible Chronic Kidney Disease.  Marland Kitchen Prothrombin Time 02/20/2012 12.8  11.6 - 15.2 seconds Final  . INR 02/20/2012 0.97  0.00 - 1.49 Final  . Color, Urine 02/20/2012 YELLOW  YELLOW Final  . APPearance 02/20/2012 CLEAR  CLEAR Final  . Specific Gravity, Urine 02/20/2012 1.009  1.005 - 1.030 Final  . pH 02/20/2012 7.5  5.0 - 8.0 Final  . Glucose, UA 02/20/2012 NEGATIVE  NEGATIVE mg/dL Final  . Hgb urine dipstick 02/20/2012 NEGATIVE  NEGATIVE Final  . Bilirubin Urine 02/20/2012 NEGATIVE  NEGATIVE Final  . Ketones, ur 02/20/2012 NEGATIVE  NEGATIVE mg/dL Final  . Protein, ur 16/02/9603 NEGATIVE  NEGATIVE mg/dL Final  . Urobilinogen, UA 02/20/2012 0.2  0.0 - 1.0 mg/dL Final    . Nitrite 54/01/8118 NEGATIVE  NEGATIVE Final  . Leukocytes, UA 02/20/2012 NEGATIVE  NEGATIVE Final   MICROSCOPIC NOT DONE ON URINES WITH NEGATIVE PROTEIN, BLOOD, LEUKOCYTES, NITRITE, OR GLUCOSE <1000 mg/dL.  Marland Kitchen MRSA, PCR 02/20/2012 NEGATIVE  NEGATIVE Final  . Staphylococcus aureus 02/20/2012 POSITIVE* NEGATIVE Final   Comment:                                 The Xpert SA Assay (FDA                          approved for NASAL specimens                          in patients over 12 years of age),                          is one component of                          a comprehensive surveillance                          program.  Test performance has                          been validated by [employer] for patients greater  than or equal to 72 year old.                          It is not intended                          to diagnose infection nor to                          guide or monitor treatment.  . Preg, Serum 02/20/2012 NEGATIVE  NEGATIVE Final    Basename 02/28/12 0405 02/27/12 0403  HGB 10.5* 11.1*    Basename 02/28/12 0405 02/27/12 0403  WBC 9.6 12.1*  RBC 3.35* 3.58*  HCT 31.4* 33.4*  PLT 370 413*    Basename 02/28/12 0405 02/27/12 0403  NA 137 135  K 3.4* 3.5  CL 104 102  CO2 25 24  BUN 6 8  CREATININE 0.73 0.69  GLUCOSE 123* 122*  CALCIUM 8.8 8.4   No results found for this basename: LABPT:2,INR:2 in the last 72 hours  X-Rays:No results found.  EKG:No orders found for this or any previous visit.   Hospital Course:  [name] is a 41 y.o. who was admitted to [facility]. They were brought to the operating room on 02/26/2012 and underwent Procedure(s): TOTAL KNEE ARTHROPLASTY.  Patient tolerated the procedure well and was later transferred to the recovery room and then to the orthopaedic floor for postoperative care.  They were given PO and IV analgesics for pain control following their  surgery.  They were given 24 hours of postoperative antibiotics of  Anti-infectives     Start     Dose/Rate Route Frequency Ordered Stop   02/26/12 1700   ceFAZolin (ANCEF) IVPB 2 g/50 mL premix        2 g 100 mL/hr over 30 Minutes Intravenous Every 6 hours 02/26/12 1338 02/27/12 0006   02/26/12 0817   ceFAZolin (ANCEF) 3 g in dextrose 5 % 50 mL IVPB        3 g 160 mL/hr over 30 Minutes Intravenous 60 min pre-op 02/26/12 0817 02/26/12 1055         and started on DVT prophylaxis in the form of Xarelto.   PT and OT were ordered for total joint protocol.  Discharge planning consulted to help with postop disposition and equipment needs.  Patient had a good night on the evening of surgery and started to get up OOB with therapy on day one.  Hemovac drain was pulled without difficulty.  Continued to work with therapy into day two.  Dressing was changed on day two and the incision was healing well.  Patient was seen in rounds and was ready to go home on day two.   Discharge Medications: Prior to Admission medications   Medication Sig Start Date End Date Taking? Authorizing Provider  buPROPion (WELLBUTRIN XL) 300 MG 24 hr tablet Take 300 mg by mouth daily with breakfast.   Yes Historical Provider, MD  cetirizine (ZYRTEC) 10 MG tablet Take 10 mg by mouth daily.   Yes Historical Provider, MD  fluticasone (FLONASE) 50 MCG/ACT nasal spray Place 1 spray into the nose daily.   Yes Historical Provider, MD  lithium carbonate (ESKALITH) 450 MG CR tablet Take 900 mg by mouth at bedtime.   Yes Historical Provider, MD  oxybutynin (DITROPAN) 5 MG tablet Take 5 mg by mouth 2 (  two) times daily.  03/13/11  Yes Historical Provider, MD  pantoprazole (PROTONIX) 40 MG tablet Take 40 mg by mouth 2 (two) times daily.  06/09/11  Yes Historical Provider, MD  QUEtiapine (SEROQUEL) 100 MG tablet Take 1/2 to 1 tab by mouth at bedtime. 09/24/11  Yes Cleotis Nipper, MD  methocarbamol (ROBAXIN) 500 MG tablet Take 1 tablet (500 mg  total) by mouth every 6 (six) hours as needed. 02/28/12   Alexzandrew Perkins, PA  oxyCODONE (OXY IR/ROXICODONE) 5 MG immediate release tablet Take 1-2 tablets (5-10 mg total) by mouth every 3 (three) hours as needed. 02/28/12   Alexzandrew Julien Girt, PA  rivaroxaban (XARELTO) 10 MG TABS tablet Take 1 tablet (10 mg total) by mouth daily with breakfast. Take Xarelto for two and a half more weeks, then discontinue Xarelto. 02/28/12   Alexzandrew Perkins, PA    Diet: Regular diet Activity:WBAT Follow-up:in 2 weeks Disposition - Home Discharged Condition: good   Discharge Orders    Future Orders Please Complete By Expires   Diet general      Call MD / Call 911      Comments:   If you experience chest pain or shortness of breath, CALL 911 and be transported to the hospital emergency room.  If you develope a fever above 101 F, pus (white drainage) or increased drainage or redness at the wound, or calf pain, call your surgeon's office.   Discharge instructions      Comments:   Pick up stool softner and laxative for home. Do not submerge incision under water. May shower. Continue to use ice for pain and swelling from surgery.  Take Xarelto for two and a half more weeks, then discontinue Xarelto.   Constipation Prevention      Comments:   Drink plenty of fluids.  Prune juice may be helpful.  You may use a stool softener, such as Colace (over the counter) 100 mg twice a day.  Use MiraLax (over the counter) for constipation as needed.   Increase activity slowly as tolerated      Patient may shower      Comments:   You may shower without a dressing once there is no drainage.  Do not wash over the wound.  If drainage remains, do not shower until drainage stops.   Driving restrictions      Comments:   No driving until released by the physician.   Lifting restrictions      Comments:   No lifting until released by the physician.   TED hose      Comments:   Use stockings (TED hose) for 3 weeks on  both leg(s).  You may remove them at night for sleeping.   Change dressing      Comments:   Change dressing daily with sterile 4 x 4 inch gauze dressing and apply TED hose. Do not submerge the incision under water.   Do not put a pillow under the knee. Place it under the heel.      Do not sit on low chairs, stoools or toilet seats, as it may be difficult to get up from low surfaces          Medication List     As of 02/28/2012  8:05 AM    STOP taking these medications         diclofenac 75 MG EC tablet   Commonly known as: VOLTAREN      PENNSAID 1.5 % Soln   Generic drug:  Diclofenac Sodium      TAKE these medications         buPROPion 300 MG 24 hr tablet   Commonly known as: WELLBUTRIN XL   Take 300 mg by mouth daily with breakfast.      cetirizine 10 MG tablet   Commonly known as: ZYRTEC   Take 10 mg by mouth daily.      fluticasone 50 MCG/ACT nasal spray   Commonly known as: FLONASE   Place 1 spray into the nose daily.      lithium carbonate 450 MG CR tablet   Commonly known as: ESKALITH   Take 900 mg by mouth at bedtime.      methocarbamol 500 MG tablet   Commonly known as: ROBAXIN   Take 1 tablet (500 mg total) by mouth every 6 (six) hours as needed.      oxybutynin 5 MG tablet   Commonly known as: DITROPAN   Take 5 mg by mouth 2 (two) times daily.      oxyCODONE 5 MG immediate release tablet   Commonly known as: Oxy IR/ROXICODONE   Take 1-2 tablets (5-10 mg total) by mouth every 3 (three) hours as needed.      pantoprazole 40 MG tablet   Commonly known as: PROTONIX   Take 40 mg by mouth 2 (two) times daily.      QUEtiapine 100 MG tablet   Commonly known as: SEROQUEL   Take 1/2 to 1 tab by mouth at bedtime.      rivaroxaban 10 MG Tabs tablet   Commonly known as: XARELTO   Take 1 tablet (10 mg total) by mouth daily with breakfast. Take Xarelto for two and a half more weeks, then discontinue Xarelto.           Follow-up Information    Follow up  with Loanne Drilling, MD. Schedule an appointment as soon as possible for a visit in 2 weeks.   Contact information:   [facility] [address], [address] [place] [address] [phone]          Signed: Patrica Duel 02/28/2012, 8:05 AM

## 2012-02-28 NOTE — Progress Notes (Signed)
CARE MANAGEMENT NOTE 02/28/2012  Patient:  CAROLANNE, FRANCISCO   Account Number:  [id number]  Date Initiated:  02/27/2012  Documentation initiated by:  Colleen Can  Subjective/Objective Assessment:   dx total knee replacemnt -right;     Action/Plan:   CM  spoke with patient. Plans are for patient to return to her home in [place], [place] where spouse will be caregiver. She wants to use [facility] agency that is in network. SHE WILL NEED DME-RW  OT  EVAL  IS PENDING   Anticipated DC Date:  02/29/2012   Anticipated DC Plan:  HOME W HOME HEALTH SERVICES  In-house referral  NA      DC Planning Services  CM consult      [facility] Choice  HOME HEALTH  DURABLE MEDICAL EQUIPMENT   Choice offered to / List presented to:  C-1 Patient   DME arranged  NA      DME agency  NA     HH arranged  HH-2 PT      HH agency  [facility]   Status of service:  Completed, signed off Medicare Important Message given?  NO (If response is "NO", the following Medicare IM given date fields will be blank) Date Medicare IM given:   Date Additional Medicare IM given:    Discharge Disposition:  HOME W HOME HEALTH SERVICES  PComments:  02/28/2012 Colleen Can, RN BSN CCM [phone] Pt discharged today. [facility] notified of discharge. Will start services tomorrow 02/29/2012.

## 2012-02-28 NOTE — Progress Notes (Signed)
Pt. Dc'd to home today. Dc instructions reviewed with pt. and husband, all concerns and questions were addressed. Supplies for dressing change and items from home were given to pt. IV was removed, dressing was clean, dry, and intact, prescriptions were provided to pt and arrangements made for home health.

## 2012-02-28 NOTE — Progress Notes (Signed)
   Subjective: 2 Days Post-Op Procedure(s) (LRB): TOTAL KNEE ARTHROPLASTY (Right) Patient reports pain as mild.   Patient seen in rounds for Dr. Lequita Halt.  Husband in room Patient is well, and has had no acute complaints or problems Patient is ready to go home today.  Objective: Vital signs in last 24 hours: Temp:  [97.7 F (36.5 C)-99.7 F (37.6 C)] 99.7 F (37.6 C) (10/04 0444) Pulse Rate:  [81-91] 91  (10/04 0444) Resp:  [15-20] 16  (10/04 0444) BP: (113-135)/(74-78) 113/76 mmHg (10/04 0444) SpO2:  [95 %-99 %] 95 % (10/04 0444)  Intake/Output from previous day:  Intake/Output Summary (Last 24 hours) at 02/28/12 0759 Last data filed at 02/28/12 0528  Gross per 24 hour  Intake 1819.33 ml  Output   4701 ml  Net -2881.67 ml    Intake/Output this shift:    Labs:  Basename 02/28/12 0405 02/27/12 0403  HGB 10.5* 11.1*    Basename 02/28/12 0405 02/27/12 0403  WBC 9.6 12.1*  RBC 3.35* 3.58*  HCT 31.4* 33.4*  PLT 370 413*    Basename 02/28/12 0405 02/27/12 0403  NA 137 135  K 3.4* 3.5  CL 104 102  CO2 25 24  BUN 6 8  CREATININE 0.73 0.69  GLUCOSE 123* 122*  CALCIUM 8.8 8.4   No results found for this basename: LABPT:2,INR:2 in the last 72 hours  EXAM: General - Patient is Alert, Appropriate and Oriented Extremity - Neurovascular intact Sensation intact distally Dorsiflexion/Plantar flexion intact Incision - clean, dry, no drainage, healing Motor Function - intact, moving foot and toes well on exam.   Assessment/Plan: 2 Days Post-Op Procedure(s) (LRB): TOTAL KNEE ARTHROPLASTY (Right) Procedure(s) (LRB): TOTAL KNEE ARTHROPLASTY (Right) Past Medical History  Diagnosis Date  . Arthritis   . GERD (gastroesophageal reflux disease)   . PCO (polycystic ovaries)   . Stress incontinence, female   . Headache     migraines-  "stress and hormonal"  . Mental disorder     bipolar/ LOV Dr Lolly Mustache  LOV 4/13 EPIC  . Depression   . Sleep apnea     no CPAP after  2007 after 150 lbs weight loss   Principal Problem:  *OA (osteoarthritis) of knee   Up with therapy Discharge home with home health Diet - Regular diet Follow up - in 2 weeks Activity - WBAT Disposition - Home Condition Upon Discharge - Good D/C Meds - See DC Summary DVT Prophylaxis - Xarelto  Glena Pharris 02/28/2012, 7:59 AM

## 2012-02-28 NOTE — Progress Notes (Signed)
Physical Therapy Treatment Patient Details Name: [name] MRN: [id number] DOB: [birth year] Today's Date: 02/28/2012 Time: 4098-1191 PT Time Calculation (min): 17 min  PT Assessment / Plan / Recommendation Comments on Treatment Session  Pt requests short break between ther ex and ambulation/stairs    Follow Up Recommendations  Home health PT    Barriers to Discharge        Equipment Recommendations  Rolling walker with 5" wheels;3 in 1 bedside comode    Recommendations for Other Services OT consult  Frequency 7X/week   Plan Discharge plan remains appropriate    Precautions / Restrictions Precautions Precautions: Knee Required Braces or Orthoses: Knee Immobilizer - Right Knee Immobilizer - Right: Discontinue once straight leg raise with < 10 degree lag Restrictions Weight Bearing Restrictions: No Other Position/Activity Restrictions: WBAT   Pertinent Vitals/Pain 5/10    Mobility  Transfers Transfers: Sit to Stand;Stand to Sit Sit to Stand: 4: Min guard;5: Supervision Stand to Sit: 4: Min guard;5: Supervision Details for Transfer Assistance: cues for R LE fwd and use of UEs to self assist Ambulation/Gait Ambulation/Gait Assistance: 4: Min guard Ambulation Distance (Feet): 123 Feet Assistive device: Rolling walker Ambulation/Gait Assistance Details: min cues for sequence and position from RW Gait Pattern: Step-to pattern;Step-through pattern Stairs: Yes Stairs Assistance: 4: Min assist Stairs Assistance Details (indicate cue type and reason): cues for sequence and foot/RW placement Stair Management Technique: No rails;Backwards;With walker Number of Stairs: 1  (twice)    Exercises Total Joint Exercises Ankle Circles/Pumps: AROM;Both;Supine;20 reps Quad Sets: AROM;Both;Supine;20 reps Heel Slides: AAROM;Right;Supine;20 reps Straight Leg Raises: AAROM;10 reps;Supine;Right;20 reps;AROM   PT Diagnosis:    PT Problem List:   PT Treatment Interventions:     PT  Goals Acute Rehab PT Goals PT Goal Formulation: With patient Time For Goal Achievement: 03/03/12 Potential to Achieve Goals: Good Pt will go Supine/Side to Sit: with supervision PT Goal: Supine/Side to Sit - Progress: Progressing toward goal Pt will go Sit to Supine/Side: with supervision PT Goal: Sit to Supine/Side - Progress: Progressing toward goal Pt will go Sit to Stand: with supervision PT Goal: Sit to Stand - Progress: Progressing toward goal Pt will go Stand to Sit: with supervision PT Goal: Stand to Sit - Progress: Progressing toward goal Pt will Ambulate: >150 feet;with supervision;with rolling walker PT Goal: Ambulate - Progress: Progressing toward goal Pt will Go Up / Down Stairs: 1-2 stairs;with min assist;with least restrictive assistive device PT Goal: Up/Down Stairs - Progress: Met  Visit Information  Last PT Received On: 02/28/12 Assistance Needed: +1    Subjective Data  Subjective: I'm going home today but now I can't borrow the equipment from my friend Patient Stated Goal: Resume previous lifestyle with decreased pain   Cognition  Overall Cognitive Status: Appears within functional limits for tasks assessed/performed Arousal/Alertness: Awake/alert Orientation Level: Appears intact for tasks assessed Behavior During Session: [facility] for tasks performed    Balance     End of Session PT - End of Session Activity Tolerance: Patient tolerated treatment well Patient left: with call bell/phone within reach;with family/visitor present;in chair Nurse Communication: Mobility status   GP     Aadi Bordner 02/28/2012, 12:50 PM

## 2012-02-28 NOTE — Progress Notes (Signed)
Physical Therapy Treatment Patient Details Name: [name] MRN: [id number] DOB: July 24, 1970 Today's Date: 02/28/2012 Time: 6295-2841 PT Time Calculation (min): 28 min  PT Assessment / Plan / Recommendation Comments on Treatment Session  Pt requests short break between ther ex and ambulation/stairs    Follow Up Recommendations  Home health PT    Barriers to Discharge        Equipment Recommendations  Rolling walker with 5" wheels;3 in 1 bedside comode    Recommendations for Other Services OT consult  Frequency 7X/week   Plan Discharge plan remains appropriate    Precautions / Restrictions Precautions Precautions: Knee Required Braces or Orthoses: Knee Immobilizer - Right Knee Immobilizer - Right: Discontinue once straight leg raise with < 10 degree lag Restrictions Weight Bearing Restrictions: No Other Position/Activity Restrictions: WBAT   Pertinent Vitals/Pain 8/10 after therapeutic ex, pt premedicated, cold packs provided.    Mobility       Exercises Total Joint Exercises Ankle Circles/Pumps: AROM;Both;Supine;20 reps Quad Sets: AROM;Both;Supine;20 reps Heel Slides: AAROM;Right;Supine;20 reps Straight Leg Raises: AAROM;10 reps;Supine;Right;20 reps;AROM   PT Diagnosis:    PT Problem List:   PT Treatment Interventions:     PT Goals Acute Rehab PT Goals PT Goal Formulation: With patient Time For Goal Achievement: 03/03/12 Potential to Achieve Goals: Good  Visit Information  Last PT Received On: 02/28/12 Assistance Needed: +1    Subjective Data  Subjective: I'm going home today but now I can't borrow the equipment from my friend Patient Stated Goal: Resume previous lifestyle with decreased pain   Cognition  Overall Cognitive Status: Appears within functional limits for tasks assessed/performed Arousal/Alertness: Awake/alert Orientation Level: Appears intact for tasks assessed Behavior During Session: [facility] for tasks performed    Balance     End of  Session PT - End of Session Activity Tolerance: Patient tolerated treatment well Patient left: with call bell/phone within reach;with family/visitor present;in chair Nurse Communication: Mobility status   GP     [name] 02/28/2012, 12:07 PM

## 2012-03-19 ENCOUNTER — Ambulatory Visit (INDEPENDENT_AMBULATORY_CARE_PROVIDER_SITE_OTHER): Payer: BC Managed Care – PPO | Admitting: Psychiatry

## 2012-03-19 ENCOUNTER — Encounter (HOSPITAL_COMMUNITY): Payer: Self-pay | Admitting: Psychiatry

## 2012-03-19 VITALS — Wt 218.0 lb

## 2012-03-19 DIAGNOSIS — F319 Bipolar disorder, unspecified: Secondary | ICD-10-CM

## 2012-03-19 MED ORDER — QUETIAPINE FUMARATE 100 MG PO TABS: ORAL_TABLET | ORAL | Status: DC

## 2012-03-19 MED ORDER — LITHIUM CARBONATE ER 450 MG PO TBCR: 900.0000 mg | EXTENDED_RELEASE_TABLET | Freq: Every day | ORAL | Status: DC

## 2012-03-19 MED ORDER — BUPROPION HCL ER (XL) 300 MG PO TB24: 300.0000 mg | ORAL_TABLET | Freq: Every day | ORAL | Status: DC

## 2012-03-19 NOTE — Progress Notes (Signed)
Chief complaint Medication management and followup.       History of present illness Patient came for her followup appointment with her husband.  She had knee surgery few days ago.  She still recovering from her pain.  She is taking pain medication.  She lost some weight as not eating very well.  She sleeps fair with pain medication.  She denies any agitation anger mood swing.  Today she find out that she is going to see a new psychiatrist on her next appointment and she feels very anxious about it.  She does not want to change her medication.  She feels best on her current psychiatric medication.  She denies any paranoia hallucination or any anger episode.  She denies any recent anger or crying spells.  She's not drinking or using any illegal substance.  Her husband also endorse much improvement on her current psychiatric medication.  Current psychiatric medication Wellbutrin XL 300 mg daily Lithium carbonate for4 50 mg 2 at bedtime Seroquel 100 mg at bedtime.    Past psychiatric history Patient has been seeing in this office since November 2006.  She was discharged from behavioral Center.  She was admitted due to serious suicidal attempt she overdosed on pills and require respirator.  Patient told that she was taking progesterone for her breakthrough bleeding for 10 days and became very manic.  However patient has a history of bipolar disorder since 1995.  She was seeing at [facility] for counseling .  In the past she has taken Prozac Depakote Neurontin Geodon and Klonopin.  She has a history of manic symptoms.  Family history Patient endorse mother has history of substance use.  Psychosocial history Patient lives with her husband.  She has one daughter.  Patient denies any history of physical sexual verbal abuse.  Her husband is very supportive.    Medical history Arthritis, GERD, polycystic ovary, stress incontinence .  She has recent knee replacement.  Mental status  examination Patient is casually dressed and well groomed.  She is walking with the help of stick .  She complained of pain but overall she is feeling better.  She is cooperative and relevant in conversation.  Her speech is soft clear and coherent. Her thought process is logical linear and goal-directed. She described her mood is sad and her affect is appropriate.  She denies any active or passive suicidal thoughts or homicidal thoughts.  She denies any auditory or visual hallucination.  Her attention and concentration is fair.  There were no flight of idea or loose association.  There no psychotic symptoms present at this time. There no tremors or extrapyramidal side effects noted. She's alert and oriented x3. Her insight judgment and impulse control is okay  Assessment Axis I Bipolar disorder Axis II deferred Axis III GERD, arthritis polycystic ovary, stress incontinence Axis IV mild to moderate Axis V 60-65  Plan Reassurance given.  I will continue her current psychiatric medication.  I explain in detail the risk and benefits of medication.  I recommend to call us if she is any question or concern about the medication or if she feels worsening of the symptom.  Patient will be seeing Dr. Dan Humphreys in 3 months.     Portion of this note is generated with voice dictation soft and may contain typographical her affair

## 2012-03-24 ENCOUNTER — Ambulatory Visit (HOSPITAL_COMMUNITY): Payer: Self-pay | Admitting: Psychiatry

## 2012-06-15 ENCOUNTER — Encounter (HOSPITAL_COMMUNITY): Payer: Self-pay | Admitting: Psychiatry

## 2012-06-15 ENCOUNTER — Ambulatory Visit (INDEPENDENT_AMBULATORY_CARE_PROVIDER_SITE_OTHER): Payer: BC Managed Care – PPO | Admitting: Psychiatry

## 2012-06-15 VITALS — Ht 68.0 in | Wt 221.2 lb

## 2012-06-15 DIAGNOSIS — F17200 Nicotine dependence, unspecified, uncomplicated: Secondary | ICD-10-CM

## 2012-06-15 DIAGNOSIS — F319 Bipolar disorder, unspecified: Secondary | ICD-10-CM

## 2012-06-15 DIAGNOSIS — F411 Generalized anxiety disorder: Secondary | ICD-10-CM

## 2012-06-15 MED ORDER — QUETIAPINE FUMARATE 100 MG PO TABS: ORAL_TABLET | ORAL | Status: DC

## 2012-06-15 MED ORDER — LITHIUM CARBONATE ER 450 MG PO TBCR: 900.0000 mg | EXTENDED_RELEASE_TABLET | Freq: Every day | ORAL | Status: DC

## 2012-06-15 MED ORDER — BUPROPION HCL ER (XL) 300 MG PO TB24: 300.0000 mg | ORAL_TABLET | Freq: Every day | ORAL | Status: DC

## 2012-06-15 NOTE — Progress Notes (Signed)
[facility] [id number] Progress Note [name] MRN: [id number] DOB: 1970-09-16 Age: 42 y.o.  Date: 06/15/2012 Start Time: 10:30 AM End Time: 10:50 AM  Chief Complaint: Chief Complaint  Patient presents with  . Depression  . Follow-up  . Medication Refill  . Establish Care   History of present illness Patient came for her followup appointment with her husband.  Pt reports that she is compliant with the psychotropic medications with good benefit and some side effects.  She notes dry mouth and increased appetite.  She is getting more active now with her new (R) knee.  Current psychiatric medication Wellbutrin XL 300 mg daily Lithium carbonate for 450 mg 2 at bedtime Seroquel 50 mg at bedtime usually, but sometimes takes 100 mg.  Past psychiatric history Patient has been seeing in this office since November 2006.  She was discharged from behavioral Center.  She was admitted due to serious suicidal attempt she overdosed on pills and require respirator.  Patient told that she was taking progesterone for her breakthrough bleeding for 10 days and became very manic.  However patient has a history of bipolar disorder since 1995.  She was seeing at [facility] for counseling .  In the past she has taken Prozac Depakote Neurontin Geodon and Klonopin.  She has a history of manic symptoms.  Family history Patient endorse mother has history of substance use. family history includes Alcohol abuse in her brother and father; Dementia in her maternal grandmother and mother; and Drug abuse in her brother.  There is no history of ADD / ADHD, and Anxiety disorder, and Bipolar disorder, and Depression, and OCD, and Physical abuse, and Paranoid behavior, and Schizophrenia, and Seizures, and Sexual abuse, .  Psychosocial history Patient lives with her husband.  She has one daughter.  Patient denies any history of physical sexual verbal abuse.  Her husband is very supportive.    Medical  history Arthritis, GERD, polycystic ovary, stress incontinence .  She has recent knee replacement.  Mental status examination Patient is casually dressed and well groomed.  She is walking with the help of stick .  She complained of pain but overall she is feeling better.  She is cooperative and relevant in conversation.  Her speech is soft clear and coherent. Her thought process is logical linear and goal-directed. She described her mood is sad and her affect is appropriate.  She denies any active or passive suicidal thoughts or homicidal thoughts.  She denies any auditory or visual hallucination.  Her attention and concentration is fair.  There were no flight of idea or loose association.  There no psychotic symptoms present at this time. There no tremors or extrapyramidal side effects noted. She's alert and oriented x3. Her insight judgment and impulse control is okay  Assessment Axis I Bipolar disorder Axis II deferred Axis III GERD, arthritis polycystic ovary, stress incontinence Axis IV mild to moderate Axis V 60-65  Plan/Discussion/Summary: I took her vitals.  I reviewed CC, tobacco/med/surg Hx, meds effects/ side effects, problem list, therapies and responses as well as current situation/symptoms discussed options. See orders and pt instructions for more details.  Orson Aloe, MD, [facility]

## 2012-06-15 NOTE — Patient Instructions (Addendum)
Watch the sleep around spring and fall.  Call if problems or concerns.  Could use "Move Free" or "Osteo bi Flex" for arthritic pain.   The important ingredients are Chondrotin Sulfate and Glucosamine.  Tumeric is also helpful for arthritis.   Krill oil and cod liver oil may be helpful for arthritis.   [employer] is a great source for all of these.  [phone]  Tilden Fossa is an excellent resource for weight reduction.  She is a Publishing rights manager who has practiced for 15 years in the field of weight management.  Her phone number is [phone].    CUT BACK/CUT OUT on sugar and carbohydrates, that means very limited fruits and starchy vegetables and very limited grains, breads  The goal is low GLYCEMIC INDEX.  CUT OUT all wheat, rye, or barley for the GLUTEN in them.  HIGH fat and LOW carbohydrate diet is the KEY.  Eat avocados, eggs, lean meat like grass fed beef and chicken  Nuts and seeds would be good foods as well.   Stevia is an excellent sweetener.  Safe for the brain.   Almond butter is awesome.  Check out all this on the Internet.

## 2012-06-19 ENCOUNTER — Ambulatory Visit (HOSPITAL_COMMUNITY): Payer: Self-pay | Admitting: Psychiatry

## 2012-09-14 ENCOUNTER — Ambulatory Visit (INDEPENDENT_AMBULATORY_CARE_PROVIDER_SITE_OTHER): Payer: BC Managed Care – PPO | Admitting: Psychiatry

## 2012-09-14 ENCOUNTER — Encounter (HOSPITAL_COMMUNITY): Payer: Self-pay | Admitting: Psychiatry

## 2012-09-14 VITALS — Wt 211.6 lb

## 2012-09-14 DIAGNOSIS — F329 Major depressive disorder, single episode, unspecified: Secondary | ICD-10-CM

## 2012-09-14 DIAGNOSIS — F411 Generalized anxiety disorder: Secondary | ICD-10-CM

## 2012-09-14 DIAGNOSIS — F319 Bipolar disorder, unspecified: Secondary | ICD-10-CM

## 2012-09-14 DIAGNOSIS — M545 Low back pain, unspecified: Secondary | ICD-10-CM

## 2012-09-14 MED ORDER — BUPROPION HCL ER (XL) 300 MG PO TB24
300.0000 mg | ORAL_TABLET | Freq: Every day | ORAL | Status: DC
Start: 2012-09-14 — End: 2012-12-03

## 2012-09-14 MED ORDER — QUETIAPINE FUMARATE 25 MG PO TABS: ORAL_TABLET | ORAL | Status: DC

## 2012-09-14 MED ORDER — LITHIUM CARBONATE ER 450 MG PO TBCR: 900.0000 mg | EXTENDED_RELEASE_TABLET | Freq: Every day | ORAL | Status: DC

## 2012-09-14 NOTE — Patient Instructions (Signed)
CUT BACK/CUT OUT on sugar and carbohydrates, that means very limited fruits and starchy vegetables and very limited grains, breads  The goal is low GLYCEMIC INDEX.  CUT OUT all wheat, rye, or barley for the GLUTEN in them.  HIGH fat and LOW carbohydrate diet is the KEY.  Eat avocados, eggs, lean meat like grass fed beef and chicken  Nuts and seeds would be good foods as well.   Stevia is an excellent sweetener.  Safe for the brain.   Truvia is also a good safe sweetener, not the baking blend form of Truvia  Almond butter is awesome.  Check out all this on the Internet.  Dr Purlmutter is on the Internet with some good info about this.   http://www.drperlmutter.com is where that is.  An excellent site for info on this diet is http://paleoleap.com  [employer] makes dark chocolate that is sweetened with Stevia that is safe.  Take care of yourself.  No one else is standing up to do the job and only you know what you need.   GET SERIOUS about taking care of yourself.  Do the next right thing and that often means doing something to care for yourself along the lines of are you hungry, are you angry, are you lonely, are you tired, are you scared?  [employer] is what that stands for.  Call if problems or concerns.  

## 2012-09-14 NOTE — Progress Notes (Signed)
[facility] [address] Progress Note HARNOOR RETA MRN: [id number] DOB: [birth year] Age: 42 y.o.  Date: 09/14/2012 Start Time: 8:35 AM End Time: 8:53 AM  Chief Complaint: Chief Complaint  Patient presents with  . Anxiety  . Depression  . Follow-up  . Medication Refill   Subjective: "We took your advice and stopped gluten and sugar and I lost 10 pounds and my husband lost 20 pounds and he now sleeps 7 hours a night and had onely slept 3 hours for all of his adult life. Depression 0/10 and Anxiety 0/10, where 0 is none and 10 is the worst.  Pain 0/10  The patient returns for follow-up appointment.  Pt reports that she is compliant with the psychotropic medications with good benefit and some side effects.  She notes dry mouth as a side effect.    She is so thrilled with the sugar free gluten free life that they now have.  Current psychiatric medication Wellbutrin XL 300 mg daily Lithium carbonate for 450 mg 2 at bedtime Seroquel 25 mg at bedtime   Past psychiatric history Patient has been seeing in this office since November 2006.  She was discharged from behavioral Center.  She was admitted due to serious suicidal attempt she overdosed on pills and require respirator.  Patient told that she was taking progesterone for her breakthrough bleeding for 10 days and became very manic.  However patient has a history of bipolar disorder since 1995.  She was seeing at [facility] for counseling .  In the past she has taken Prozac Depakote Neurontin Geodon and Klonopin.  She has a history of manic symptoms.  Family history family history includes Alcohol abuse in her brother and father; Dementia in her maternal grandmother and mother; and Drug abuse in her brother.  There is no history of ADD / ADHD, and Anxiety disorder, and Bipolar disorder, and Depression, and OCD, and Physical abuse, and Paranoid behavior, and Schizophrenia, and Seizures, and Sexual abuse, .  Psychosocial  history Patient lives with her husband.  She has one daughter.  Patient denies any history of physical sexual verbal abuse.  Her husband is very supportive.    Medical history Arthritis, GERD, polycystic ovary, stress incontinence .  She has recent knee replacement.  Mental status examination Patient is casually dressed and well groomed.  She is walking with the help of stick .  She complained of pain but overall she is feeling better.  She is cooperative and relevant in conversation.  Her speech is soft clear and coherent. Her thought process is logical linear and goal-directed. She described her mood is sad and her affect is appropriate.  She denies any active or passive suicidal thoughts or homicidal thoughts.  She denies any auditory or visual hallucination.  Her attention and concentration is fair.  There were no flight of idea or loose association.  There no psychotic symptoms present at this time. There no tremors or extrapyramidal side effects noted. She's alert and oriented x3. Her insight judgment and impulse control is okay  Assessment Axis I Bipolar disorder Axis II deferred Axis III GERD, arthritis polycystic ovary, stress incontinence Axis IV mild to moderate Axis V 60-65  Plan/Discussion: I took her vitals.  I reviewed CC, tobacco/med/surg Hx, meds effects/ side effects, problem list, therapies and responses as well as current situation/symptoms discussed options. Continue current effective medications. See orders and pt instructions for more details.  MEDICATIONS this encounter: No orders of the defined types were placed  in this encounter.    Medical Decision Making Problem Points:  Established problem, stable/improving (1), Review of last therapy session (1) and Review of psycho-social stressors (1) Data Points:  Review or order clinical lab tests (1) Review of medication regiment & side effects (2)  I certify that outpatient services furnished can reasonably be  expected to improve the patient's condition.   Orson Aloe, MD, [facility]

## 2012-09-23 ENCOUNTER — Telehealth (HOSPITAL_COMMUNITY): Payer: Self-pay | Admitting: Psychiatry

## 2012-09-23 NOTE — Telephone Encounter (Signed)
Informed pt that her lithium level was 0.8 in the range.  She is doing well on the meds.  Will see her at the next appointment.

## 2012-09-28 ENCOUNTER — Telehealth (HOSPITAL_COMMUNITY): Payer: Self-pay | Admitting: Psychiatry

## 2012-09-28 NOTE — Telephone Encounter (Signed)
Discussed with pt by phone the notice that I got about the need to monitor Lithium, level more frequent when also on Voltaren.  We agree to do so.  Her last level was 0.8 per last weeks report.

## 2012-11-30 ENCOUNTER — Ambulatory Visit (HOSPITAL_COMMUNITY): Payer: Self-pay | Admitting: Psychiatry

## 2012-12-03 ENCOUNTER — Ambulatory Visit (INDEPENDENT_AMBULATORY_CARE_PROVIDER_SITE_OTHER): Payer: BC Managed Care – PPO | Admitting: Psychiatry

## 2012-12-03 ENCOUNTER — Encounter (HOSPITAL_COMMUNITY): Payer: Self-pay | Admitting: Psychiatry

## 2012-12-03 ENCOUNTER — Ambulatory Visit (HOSPITAL_COMMUNITY): Payer: Self-pay | Admitting: Psychiatry

## 2012-12-03 VITALS — Wt 216.0 lb

## 2012-12-03 DIAGNOSIS — F319 Bipolar disorder, unspecified: Secondary | ICD-10-CM

## 2012-12-03 MED ORDER — QUETIAPINE FUMARATE 25 MG PO TABS: ORAL_TABLET | ORAL | Status: DC

## 2012-12-03 MED ORDER — LITHIUM CARBONATE ER 450 MG PO TBCR: 900.0000 mg | EXTENDED_RELEASE_TABLET | Freq: Every day | ORAL | Status: DC

## 2012-12-03 MED ORDER — BUPROPION HCL ER (XL) 300 MG PO TB24: 300.0000 mg | ORAL_TABLET | Freq: Every day | ORAL | Status: DC

## 2012-12-03 NOTE — Progress Notes (Signed)
[facility] [address] Progress Note [name] MRN: [id number] DOB: [birth year] Age: 42 y.o.  Date: 12/03/2012  Chief Complaint: Chief Complaint  Patient presents with  . Follow-up  . Medication Refill   History of present illness. Patient is a 42 year old Caucasian female who came for her followup appointment.  She's doing better on her current psychiatric medication.  She sleeping better and denied any side effects of medication.  Patient endorsed that her husband lost weight since he is taking gluten free diet.  Patient is also very happy to visit his family member.  She sleeping better.  She denies any agitation anger or mood swing.  She wants to continue her current psychiatric medication.  She denies any recent paranoia, hallucination or any active or passive suicidal thinking.  Current psychiatric medication Wellbutrin XL 300 mg daily Lithium carbonate for 450 mg 2 at bedtime Seroquel 25 mg at bedtime   Past psychiatric history Patient has been seeing in this office since November 2006.  She was discharged from behavioral Center.  She was admitted due to serious suicidal attempt she overdosed on pills and require respirator.  Patient told that she was taking progesterone for her breakthrough bleeding for 10 days and became very manic.  However patient has a history of bipolar disorder since 1995.  She was seeing at [facility] for counseling .  In the past she has taken Prozac Depakote Neurontin Geodon and Klonopin.  She has a history of manic symptoms.  Family history family history includes Alcohol abuse in her brother and father; Dementia in her maternal grandmother and mother; and Drug abuse in her brother.  There is no history of ADD / ADHD, and Anxiety disorder, and Bipolar disorder, and Depression, and OCD, and Physical abuse, and Paranoid behavior, and Schizophrenia, and Seizures, and Sexual abuse, .  Psychosocial history Patient lives with her husband.  She  has one daughter.  Patient denies any history of physical sexual verbal abuse.  Her husband is very supportive.    Medical history Arthritis, GERD, polycystic ovary, stress incontinence .  She has recent knee replacement.  Mental status examination Patient is casually dressed and well groomed.  She is pleasant and cooperative.  She maintained good eye contact.  Her speech is clear and coherent.  Her thought process is logical.  There were no flight of ideas or any loose association.  She denies any active or passive suicidal thoughts or homicidal thoughts.  There were no delusion or psychotic symptoms present at this time.  She's alert and oriented x3.  Her psychomotor activity is normal.  She described her mood is good and her affect is mood appropriate.  Her insight judgment and impulse control is okay.  Assessment Axis I Bipolar disorder Axis II deferred Axis III GERD, arthritis polycystic ovary, stress incontinence Axis IV mild to moderate Axis V 60-65  Plan/Discussion: Recommend to continue current psychiatric medication.  Risk and benefit discussed in detail.  Recommend to call us back if she is any question or concern.  Followup in 3 months.   MEDICATIONS this encounter: Meds ordered this encounter  Medications  . diclofenac (VOLTAREN) 75 MG EC tablet    Sig:   . QUEtiapine (SEROQUEL) 25 MG tablet    Sig: Take 1 tab by mouth at bedtime.    Dispense:  90 tablet    Refill:  0    90 day supply, hold until pt calls for this  . lithium carbonate (ESKALITH) 450 MG  CR tablet    Sig: Take 2 tablets (900 mg total) by mouth at bedtime.    Dispense:  180 tablet    Refill:  0    90 day supply  . buPROPion (WELLBUTRIN XL) 300 MG 24 hr tablet    Sig: Take 1 tablet (300 mg total) by mouth daily with breakfast.    Dispense:  90 tablet    Refill:  0    90 day supply, hold until pt calls for this    Medical Decision Making Problem Points:  Established problem, stable/improving (1),  Review of last therapy session (1) and Review of psycho-social stressors (1) Data Points:  Review or order clinical lab tests (1) Review of medication regiment & side effects (2)  I certify that outpatient services furnished can reasonably be expected to improve the patient's condition.   Sye Schroepfer T., MD,

## 2013-03-04 ENCOUNTER — Encounter (HOSPITAL_COMMUNITY): Payer: Self-pay | Admitting: Psychiatry

## 2013-03-04 ENCOUNTER — Ambulatory Visit (HOSPITAL_COMMUNITY): Payer: Self-pay | Admitting: Psychiatry

## 2013-03-10 ENCOUNTER — Ambulatory Visit (INDEPENDENT_AMBULATORY_CARE_PROVIDER_SITE_OTHER): Payer: BC Managed Care – PPO | Admitting: Psychiatry

## 2013-03-10 ENCOUNTER — Encounter (HOSPITAL_COMMUNITY): Payer: Self-pay | Admitting: Psychiatry

## 2013-03-10 VITALS — BP 108/78 | Ht 68.0 in | Wt 212.0 lb

## 2013-03-10 DIAGNOSIS — F319 Bipolar disorder, unspecified: Secondary | ICD-10-CM

## 2013-03-10 MED ORDER — LITHIUM CARBONATE ER 450 MG PO TBCR: 900.0000 mg | EXTENDED_RELEASE_TABLET | Freq: Every day | ORAL | Status: DC

## 2013-03-10 MED ORDER — BUPROPION HCL ER (XL) 300 MG PO TB24: 300.0000 mg | ORAL_TABLET | Freq: Every day | ORAL | Status: DC

## 2013-03-10 NOTE — Progress Notes (Signed)
Patient ID: [name], female   DOB: 1970/06/05, 42 y.o.   MRN: [id number] [facility] [address] Progress Note [name] MRN: [id number] DOB: [birth year] Age: 42 y.o.  Date: 03/10/2013  Chief Complaint: Chief Complaint  Patient presents with  . Manic Behavior  . Depression  . Follow-up   History of present illness. This patient is a 42 year old married white female lives with her husband in [place]. 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 She has a 59 year old daughter lives in [place].  The patient states that at age 53 she started out being depressed but eventually had periods of mania. She would get very high and productive and then crash. In 2003 the family lost everything financially. Part of this was her fault because she was spending money without bounds. She's been $9000 in one week and for example.  Her family doctor diagnosed her with bipolar disorder. He worked in conjunction with a Veterinary surgeon and the counselor made recommendations for medications. The patient ended up on high doses of Seroquel antidepressants and clonazepam. She gained up to 300 pounds and was placed on progesterone because she was not having her period. At this point she overuses clonazepam became extremely confused and overdosed on all her medications. She ended up in the behavioral health hospital in 2006. Her medications were reorganized and she has done much better ever since.  Currently, she is doing well. Her sleep is good. Her husband is putting safeguards as she can over spend money. She still tends to overeat at that. She had knee replacement last year but is trying to start exercising. Her mood has been stable and she has no suicidal ideation. She no longer feels the need to use Seroquel  Current psychiatric medication Wellbutrin XL 300 mg daily Lithium carbonate for 450 mg 2 at bedtime   Past psychiatric history Patient has been seeing in this office since November  2006.  She was discharged from behavioral Center.  She was admitted due to serious suicidal attempt she overdosed on pills and require respirator.  Patient told that she was taking progesterone for her breakthrough bleeding for 10 days and became very manic.  However patient has a history of bipolar disorder since 1995.  She was seeing at [facility] for counseling .  In the past she has taken Prozac Depakote Neurontin Geodon and Klonopin.  She has a history of manic symptoms.  Family history family history includes Alcohol abuse in her brother and father; Dementia in her maternal grandmother and mother; Depression in her mother; Drug abuse in her brother. There is no history of ADD / ADHD, Anxiety disorder, Bipolar disorder, OCD, Physical abuse, Paranoid behavior, Schizophrenia, Seizures, or Sexual abuse.  Psychosocial history Patient lives with her husband.  She has one daughter.  Patient denies any history of physical sexual verbal abuse.  Her husband is very supportive.    Medical history Arthritis, GERD, polycystic ovary, stress incontinence .  She has recent knee replacement.  Mental status examination Patient is casually dressed and well groomed.  She is pleasant and cooperative.  She maintained good eye contact.  Her speech is clear and coherent.  Her thought process is logical.  There were no flight of ideas or any loose association.  She denies any active or passive suicidal thoughts or homicidal thoughts.  There were no delusion or psychotic symptoms present at this time.  She's alert and oriented x3.  Her psychomotor activity is normal.  She described  her mood is good and her affect is mood appropriate.  Her insight judgment and impulse control is okay.  Assessment Axis I Bipolar disorder Axis II deferred Axis III GERD, arthritis polycystic ovary, stress incontinence Axis IV mild to moderate Axis V 60-65  Plan/Discussion: Recommend to continue current psychiatric medication.  We'll check a lithium level TSH and basic metabolic panel. We reviewed the fact that lithium level can be increased by her use of diclofenac  Risk and benefit discussed in detail.  Recommend to call us back if she is any question or concern.  Followup in 3 months.   MEDICATIONS this encounter: Meds ordered this encounter  Medications  . oxybutynin (DITROPAN) 5 MG tablet    Sig:   . clindamycin (CLEOCIN) 300 MG capsule    Sig:   . buPROPion (WELLBUTRIN XL) 300 MG 24 hr tablet    Sig: Take 1 tablet (300 mg total) by mouth daily with breakfast.    Dispense:  90 tablet    Refill:  0    90 day supply, hold until pt calls for this  . lithium carbonate (ESKALITH) 450 MG CR tablet    Sig: Take 2 tablets (900 mg total) by mouth at bedtime.    Dispense:  180 tablet    Refill:  0    90 day supply    Medical Decision Making Problem Points:  Established problem, stable/improving (1), Review of last therapy session (1) and Review of psycho-social stressors (1) Data Points:  Review or order clinical lab tests (1) Review of medication regiment & side effects (2)  I certify that outpatient services furnished can reasonably be expected to improve the patient's condition.   Diannia Ruder, MD,

## 2013-03-31 LAB — LITHIUM LEVEL: Lithium Lvl: 0.8 mEq/L (ref 0.80–1.40)

## 2013-03-31 LAB — TSH: TSH: 1.279 u[IU]/mL (ref 0.350–4.500)

## 2013-03-31 LAB — BASIC METABOLIC PANEL
BUN: 9 mg/dL (ref 6–23)
Calcium: 9.7 mg/dL (ref 8.4–10.5)
Chloride: 106 mEq/L (ref 96–112)
Glucose, Bld: 94 mg/dL (ref 70–99)
Sodium: 136 mEq/L (ref 135–145)

## 2013-04-09 ENCOUNTER — Telehealth (HOSPITAL_COMMUNITY): Payer: Self-pay | Admitting: Psychiatry

## 2013-04-12 NOTE — Telephone Encounter (Signed)
Tried to call x2. Told number incorrrect

## 2013-04-15 NOTE — Telephone Encounter (Signed)
Tried to call several times, number disconnected

## 2013-06-07 ENCOUNTER — Ambulatory Visit (HOSPITAL_COMMUNITY): Payer: Self-pay | Admitting: Psychiatry

## 2013-06-09 ENCOUNTER — Other Ambulatory Visit (HOSPITAL_COMMUNITY): Payer: Self-pay | Admitting: Psychiatry

## 2013-06-09 ENCOUNTER — Telehealth (HOSPITAL_COMMUNITY): Payer: Self-pay | Admitting: Psychiatry

## 2013-06-09 ENCOUNTER — Ambulatory Visit (HOSPITAL_COMMUNITY): Payer: Self-pay | Admitting: Psychiatry

## 2013-06-09 DIAGNOSIS — F319 Bipolar disorder, unspecified: Secondary | ICD-10-CM

## 2013-06-09 MED ORDER — LITHIUM CARBONATE ER 450 MG PO TBCR: 900.0000 mg | EXTENDED_RELEASE_TABLET | Freq: Every day | ORAL | Status: DC

## 2013-06-09 NOTE — Telephone Encounter (Signed)
done

## 2013-06-18 ENCOUNTER — Encounter (HOSPITAL_COMMUNITY): Payer: Self-pay | Admitting: Psychiatry

## 2013-06-18 ENCOUNTER — Ambulatory Visit (INDEPENDENT_AMBULATORY_CARE_PROVIDER_SITE_OTHER): Payer: BC Managed Care – PPO | Admitting: Psychiatry

## 2013-06-18 VITALS — BP 120/80 | Ht 68.0 in | Wt 224.0 lb

## 2013-06-18 DIAGNOSIS — F319 Bipolar disorder, unspecified: Secondary | ICD-10-CM

## 2013-06-18 MED ORDER — LITHIUM CARBONATE ER 450 MG PO TBCR: 900.0000 mg | EXTENDED_RELEASE_TABLET | Freq: Every day | ORAL | Status: DC

## 2013-06-18 MED ORDER — BUPROPION HCL ER (XL) 300 MG PO TB24: 300.0000 mg | ORAL_TABLET | Freq: Every day | ORAL | Status: DC

## 2013-06-18 NOTE — Progress Notes (Signed)
Patient ID: [name], female   DOB: 10-07-1970, 43 y.o.   MRN: [id number] Patient ID: [name], female   DOB: 03/10/1971, 43 y.o.   MRN: [id number] [facility] [id number] Progress Note [name] MRN: [id number] DOB: [birth year] Age: 43 y.o.  Date: 06/18/2013  Chief Complaint: Chief Complaint  Patient presents with  . Depression  . Manic Behavior  . Follow-up   History of present illness. This patient is a 43 year old married white female lives with her husband in [place]. 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 She has a 35 year old daughter lives in [place].  The patient states that at age 9 she started out being depressed but eventually had periods of mania. She would get very high and productive and then crash. In 2003 the family lost everything financially. Part of this was her fault because she was spending money without bounds. She's been $9000 in one week and for example.  Her family doctor diagnosed her with bipolar disorder. He worked in conjunction with a Veterinary surgeon and the counselor made recommendations for medications. The patient ended up on high doses of Seroquel antidepressants and clonazepam. She gained up to 300 pounds and was placed on progesterone because she was not having her period. At this point she overuses clonazepam became extremely confused and overdosed on all her medications. She ended up in the behavioral health hospital in 2006. Her medications were reorganized and she has done much better ever since.  Patient returns after 3 months. She's doing very well. Her mood is good. She's learned to avoid people in her life that are "toxic" such as some of her family members. Her work is going well and she is exercising several times a week. She does have it insomnia at times but would rather not go on medication. She's going to try something over-the-counter like Benadryl. Melatonin has not worked for her  Current psychiatric  medication Wellbutrin XL 300 mg daily Lithium carbonate for 450 mg 2 at bedtime   Past psychiatric history Patient has been seeing in this office since November 2006.  She was discharged from behavioral Center.  She was admitted due to serious suicidal attempt she overdosed on pills and require respirator.  Patient told that she was taking progesterone for her breakthrough bleeding for 10 days and became very manic.  However patient has a history of bipolar disorder since 1995.  She was seeing at [facility] for counseling .  In the past she has taken Prozac Depakote Neurontin Geodon and Klonopin.  She has a history of manic symptoms.  Family history family history includes Alcohol abuse in her brother and father; Dementia in her maternal grandmother and mother; Depression in her mother; Drug abuse in her brother. There is no history of ADD / ADHD, Anxiety disorder, Bipolar disorder, OCD, Physical abuse, Paranoid behavior, Schizophrenia, Seizures, or Sexual abuse.  Psychosocial history Patient lives with her husband.  She has one daughter.  Patient denies any history of physical sexual verbal abuse.  Her husband is very supportive.    Medical history Arthritis, GERD, polycystic ovary, stress incontinence .  She has recent knee replacement.  Mental status examination Patient is casually dressed and well groomed.  She is pleasant and cooperative.  She maintained good eye contact.  Her speech is clear and coherent.  Her thought process is logical.  There were no flight of ideas or any loose association.  She denies any active or passive suicidal thoughts or  homicidal thoughts.  There were no delusion or psychotic symptoms present at this time.  She's alert and oriented x3.  Her psychomotor activity is normal.  She described her mood is good and her affect is mood appropriate.  Her insight judgment and impulse control is okay.  Assessment Axis I Bipolar disorder Axis II deferred Axis III  GERD, arthritis polycystic ovary, stress incontinence Axis IV mild to moderate Axis V 60-65  Plan/Discussion: Recommend to continue current psychiatric medication. Lithium level is therapeutic no other labs were normal  Risk and benefit discussed in detail.  Recommend to call us back if she is any question or concern.  Followup in 3 months.   MEDICATIONS this encounter: Meds ordered this encounter  Medications  . lithium carbonate (ESKALITH) 450 MG CR tablet    Sig: Take 2 tablets (900 mg total) by mouth at bedtime.    Dispense:  180 tablet    Refill:  0    90 day supply  . buPROPion (WELLBUTRIN XL) 300 MG 24 hr tablet    Sig: Take 1 tablet (300 mg total) by mouth daily with breakfast.    Dispense:  90 tablet    Refill:  0    90 day supply    Medical Decision Making Problem Points:  Established problem, stable/improving (1), Review of last therapy session (1) and Review of psycho-social stressors (1) Data Points:  Review or order clinical lab tests (1) Review of medication regiment & side effects (2)  I certify that outpatient services furnished can reasonably be expected to improve the patient's condition.   Diannia RuderOSS, DEBORAH, MD,

## 2013-09-13 ENCOUNTER — Ambulatory Visit (HOSPITAL_COMMUNITY): Payer: Self-pay | Admitting: Psychiatry

## 2013-09-16 ENCOUNTER — Ambulatory Visit (INDEPENDENT_AMBULATORY_CARE_PROVIDER_SITE_OTHER): Payer: BC Managed Care – PPO | Admitting: Psychiatry

## 2013-09-16 ENCOUNTER — Encounter (HOSPITAL_COMMUNITY): Payer: Self-pay | Admitting: Psychiatry

## 2013-09-16 VITALS — BP 120/84 | Ht 68.0 in | Wt 220.0 lb

## 2013-09-16 DIAGNOSIS — F319 Bipolar disorder, unspecified: Secondary | ICD-10-CM

## 2013-09-16 MED ORDER — LITHIUM CARBONATE ER 450 MG PO TBCR: 900.0000 mg | EXTENDED_RELEASE_TABLET | Freq: Every day | ORAL | Status: DC

## 2013-09-16 MED ORDER — BUPROPION HCL ER (XL) 300 MG PO TB24: 300.0000 mg | ORAL_TABLET | Freq: Every day | ORAL | Status: DC

## 2013-09-16 NOTE — Progress Notes (Signed)
Patient ID: [name], female   DOB: 10/05/1970, 43 y.o.   MRN: [id number] Patient ID: [name], female   DOB: 07/28/1970, 43 y.o.   MRN: [id number] Patient ID: [name], female   DOB: 06/06/1970, 43 y.o.   MRN: [id number] [facility] [id number] Progress Note [name] MRN: [id number] DOB: 12/14/1970 Age: 43 y.o.  Date: 09/16/2013  Chief Complaint: Chief Complaint  Patient presents with  . Depression  . Manic Behavior  . Follow-up   History of present illness. This patient is a 43 year old married white female [place]. 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 She has a 863 year old [place].  The patient states that at age 43 she started out being depressed but eventually had periods of mania. She would get very high and productive and then crash. In 2003 the family lost everything financially. Part of this was her fault because she was spending money without bounds. She's been $9000 in one week and for example.  Her family doctor diagnosed her with bipolar disorder. He worked in conjunction with a Veterinary surgeoncounselor and the counselor made recommendations for medications. The patient ended up on high doses of Seroquel antidepressants and clonazepam. She gained up to 300 pounds and was placed on progesterone because she was not having her period. At this point she overuses clonazepam became extremely confused and overdosed on all her medications. She ended up in the behavioral health hospital in 2006. Her medications were reorganized and she has done much better ever since.  Patient returns after 3 months. She's doing very well. Her mood is good. She's not had depressive or manic symptoms. She still has some bouts of insomnia at times but they're short lived and she doesn't want to take more medications for them. 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 She enjoys her work. She does  worry about her mother who has dementia and sometimes does remember her name. Her last lithium level was 0.8 which is therapeutic  Current psychiatric medication Wellbutrin XL 300 mg daily Lithium carbonate for 450 mg 2 at bedtime   Past psychiatric history Patient has been seeing in this office since November 2006.  She was discharged from behavioral Center.  She was admitted due to serious suicidal attempt she overdosed on pills and require respirator.  Patient told that she was taking progesterone for her breakthrough bleeding for 10 days and became very manic.  However patient has a history of bipolar disorder since 1995.  [facility] .  In the past she has taken Prozac Depakote Neurontin Geodon and Klonopin.  She has a history of manic symptoms.  Family history family history includes Alcohol abuse in her brother and father; Dementia in her maternal grandmother and mother; Depression in her mother; Drug abuse in her brother. There is no history of ADD / ADHD, Anxiety disorder, Bipolar disorder, OCD, Physical abuse, Paranoid behavior, Schizophrenia, Seizures, or Sexual abuse.  Psychosocial history Patient lives with her husband.  She has one daughter.  Patient denies any history of physical sexual verbal abuse.  Her husband is very supportive.    Medical history Arthritis, GERD, polycystic ovary, stress incontinence .  She has recent knee replacement.  Mental status examination Patient is casually dressed and well groomed.  She is pleasant and cooperative.  She maintained good eye contact.  Her speech is clear  and coherent.  Her thought process is logical.  There were no flight of ideas or any loose association.  She denies any active or passive suicidal thoughts or homicidal thoughts.  There were no delusion or psychotic symptoms present at this time.  She's alert and oriented x3.  Her psychomotor activity is normal.  She described her mood is good and  her affect is mood appropriate.  Her insight judgment and impulse control is okay.  Assessment Axis I Bipolar disorder Axis II deferred Axis III GERD, arthritis polycystic ovary, stress incontinence Axis IV mild to moderate Axis V 60-65  Plan/Discussion: Recommend to continue current psychiatric medication. Lithium level is therapeutic and all other labs were normal  Risk and benefit discussed in detail.  Recommend to call us back if she is any question or concern.  Followup in 3 months. She'll get another lithium level prior to return   MEDICATIONS this encounter: Meds ordered this encounter  Medications  . buPROPion (WELLBUTRIN XL) 300 MG 24 hr tablet    Sig: Take 1 tablet (300 mg total) by mouth daily with breakfast.    Dispense:  90 tablet    Refill:  0    90 day supply  . lithium carbonate (ESKALITH) 450 MG CR tablet    Sig: Take 2 tablets (900 mg total) by mouth at bedtime.    Dispense:  180 tablet    Refill:  0    90 day supply    Medical Decision Making Problem Points:  Established problem, stable/improving (1), Review of last therapy session (1) and Review of psycho-social stressors (1) Data Points:  Review or order clinical lab tests (1) Review of medication regiment & side effects (2)  I certify that outpatient services furnished can reasonably be expected to improve the patient's condition.   Diannia Rudereborah Tobin Witucki, MD,

## 2013-10-04 ENCOUNTER — Ambulatory Visit (INDEPENDENT_AMBULATORY_CARE_PROVIDER_SITE_OTHER): Payer: BC Managed Care – PPO | Admitting: Family Medicine

## 2013-10-04 ENCOUNTER — Ambulatory Visit: Payer: BC Managed Care – PPO

## 2013-10-04 VITALS — BP 110/78 | HR 75 | Temp 98.3°F | Resp 16 | Ht 67.0 in | Wt 223.0 lb

## 2013-10-04 DIAGNOSIS — T148XXA Other injury of unspecified body region, initial encounter: Secondary | ICD-10-CM

## 2013-10-04 DIAGNOSIS — M79642 Pain in left hand: Secondary | ICD-10-CM

## 2013-10-04 DIAGNOSIS — M79609 Pain in unspecified limb: Secondary | ICD-10-CM

## 2013-10-04 NOTE — Progress Notes (Signed)
Chief Complaint:  Chief Complaint  Patient presents with  . Hand Pain    X 3 weeks, left hand    HPI: [name] is a 43 y.o. female who is here for left hand pain, she is  right hand dominant Left hand pain for the last 3 weeks, she has had nki, she has 2/10 throbbing pain, worse with movement She has DJD but this is diferent, it is constant so she is worried  Past Medical History  Diagnosis Date  . Arthritis   . GERD (gastroesophageal reflux disease)   . PCO (polycystic ovaries)   . Stress incontinence, female   . Headache(784.0)     migraines-  "stress and hormonal"  . Mental disorder     bipolar/ LOV Dr Lolly MustacheArfeen  LOV 4/13 EPIC  . Depression   . Sleep apnea     no CPAP after 2007 after 150 lbs weight loss  . Bipolar disorder    Past Surgical History  Procedure Laterality Date  . No past surgeries    . Total knee arthroplasty  02/26/2012    Procedure: TOTAL KNEE ARTHROPLASTY;  Surgeon: Loanne DrillingFrank V Aluisio, MD;  Location: [facility];  Service: Orthopedics;  Laterality: Right;   History   Social History  . Marital Status: Married    Spouse Name: N/A    Number of Children: N/A  . Years of Education: N/A   Social History Main Topics  . Smoking status: Former Smoker    Quit date: 06/11/2009  . Smokeless tobacco: Never Used  . Alcohol Use: No  . Drug Use: No  . Sexual Activity: Yes   Other Topics Concern  . None   Social History Narrative  . None   Family History  Problem Relation Age of Onset  . Dementia Mother   . Depression Mother   . Alcohol abuse Father   . Alcohol abuse Brother   . Drug abuse Brother   . Dementia Maternal Grandmother   . ADD / ADHD Neg Hx   . Anxiety disorder Neg Hx   . Bipolar disorder Neg Hx   . OCD Neg Hx   . Physical abuse Neg Hx   . Paranoid behavior Neg Hx   . Schizophrenia Neg Hx   . Seizures Neg Hx   . Sexual abuse Neg Hx    Allergies  Allergen Reactions  . Klonopin [Clonazepam] Anaphylaxis    Required  hospitalization   And psychiatric care  . Cefaclor     REACTION: drowsy  . Milk-Related Compounds Diarrhea, Nausea And Vomiting and Other (See Comments)    gas  . Penicillins     REACTION: itchy/rash   Prior to Admission medications   Medication Sig Start Date End Date Taking? Authorizing Provider  buPROPion (WELLBUTRIN XL) 300 MG 24 hr tablet Take 1 tablet (300 mg total) by mouth daily with breakfast. 09/16/13  Yes Diannia Rudereborah Ross, MD  diclofenac (VOLTAREN) 75 MG EC tablet  09/12/12  Yes Historical Provider, MD  lithium carbonate (ESKALITH) 450 MG CR tablet Take 2 tablets (900 mg total) by mouth at bedtime. 09/16/13  Yes Diannia Rudereborah Ross, MD  oxybutynin (DITROPAN) 5 MG tablet  12/25/12  Yes Historical Provider, MD  pantoprazole (PROTONIX) 40 MG tablet Take 40 mg by mouth 2 (two) times daily.  06/09/11  Yes Historical Provider, MD  cetirizine (ZYRTEC) 10 MG tablet Take 10 mg by mouth daily.    Historical Provider, MD  clindamycin (CLEOCIN) 300 MG  capsule  02/15/13   Historical Provider, MD  fluticasone (FLONASE) 50 MCG/ACT nasal spray Place 1 spray into the nose daily.    Historical Provider, MD  rivaroxaban (XARELTO) 10 MG TABS tablet Take 1 tablet (10 mg total) by mouth daily with breakfast. Take Xarelto for two and a half more weeks, then discontinue Xarelto. 02/28/12   Alexzandrew Perkins, PA-C     ROS: The patient denies fevers, chills, night sweats, unintentional weight loss, chest pain, palpitations, wheezing, dyspnea on exertion, nausea, vomiting, abdominal pain, dysuria, hematuria, melena, numbness, weakness, or tingling.   All other systems have been reviewed and were otherwise negative with the exception of those mentioned in the HPI and as above.    PHYSICAL EXAM: Filed Vitals:   10/04/13 0858  BP: 110/78  Pulse: 75  Temp: 98.3 F (36.8 C)  Resp: 16   Filed Vitals:   10/04/13 0858  Height: 5\' 7"  (1.702 m)  Weight: 223 lb (101.152 kg)   Body mass index is 34.92  kg/(m^2).  General: Alert, no acute distress HEENT:  Normocephalic, atraumatic, oropharynx patent. EOMI, PERRLA Cardiovascular:  Regular rate and rhythm, no rubs murmurs or gallops.  No Carotid bruits, radial pulse intact. No pedal edema.  Respiratory: Clear to auscultation bilaterally.  No wheezes, rales, or rhonchi.  No cyanosis, no use of accessory musculature GI: No organomegaly, abdomen is soft and non-tender, positive bowel sounds.  No masses. Skin: No rashes. Neurologic: Facial musculature symmetric. Psychiatric: Patient is appropriate throughout our interaction. Lymphatic: No cervical lymphadenopathy Musculoskeletal: Gait intact. Normal hand exam 5/5 strength, sensation intact   LABS: Results for orders placed in visit on 03/10/13  LITHIUM LEVEL      Result Value Ref Range   Lithium Lvl 0.80  0.80 - 1.40 mEq/L  BASIC METABOLIC PANEL      Result Value Ref Range   Sodium 136  135 - 145 mEq/L   Potassium 4.8  3.5 - 5.3 mEq/L   Chloride 106  96 - 112 mEq/L   CO2 26  19 - 32 mEq/L   Glucose, Bld 94  70 - 99 mg/dL   BUN 9  6 - 23 mg/dL   Creat 6.570.87  8.460.50 - 9.621.10 mg/dL   Calcium 9.7  8.4 - 95.210.5 mg/dL  TSH      Result Value Ref Range   TSH 1.279  0.350 - 4.500 uIU/mL     EKG/XRAY:   Primary read interpreted by [facility] Conley [facility]sLe [facility]. Negative fx or dislocation   ASSESSMENT/PLAN: Encounter Diagnoses  Name Primary?  . Left hand pain Yes  . Sprain and strain    Tylenol and Diclofenac prn She soul immobilize 4th and 5th and see if rest will help ACE wrap and soft aluminum splint F/u prn, refer to Dr Romilda GarretGramick prn since she ahs been seen by [facility] in the past for her knee  Gross sideeffects, risk and benefits, and alternatives of medications d/w patient. Patient is aware that all medications have potential sideeffects and we are unable to predict every sideeffect or drug-drug interaction that may occur.  Lenell Antuhao P Jahon Bart, DO 10/04/2013 10:33 AM

## 2013-11-06 LAB — LITHIUM LEVEL: Lithium Lvl: 1 mEq/L (ref 0.80–1.40)

## 2013-12-16 ENCOUNTER — Ambulatory Visit (INDEPENDENT_AMBULATORY_CARE_PROVIDER_SITE_OTHER): Payer: BC Managed Care – PPO | Admitting: Psychiatry

## 2013-12-16 ENCOUNTER — Encounter (HOSPITAL_COMMUNITY): Payer: Self-pay | Admitting: Psychiatry

## 2013-12-16 VITALS — BP 90/64 | Ht 67.0 in | Wt 213.0 lb

## 2013-12-16 DIAGNOSIS — F319 Bipolar disorder, unspecified: Secondary | ICD-10-CM

## 2013-12-16 MED ORDER — LITHIUM CARBONATE ER 450 MG PO TBCR
900.0000 mg | EXTENDED_RELEASE_TABLET | Freq: Every day | ORAL | Status: DC
Start: 2013-12-16 — End: 2014-03-18

## 2013-12-16 MED ORDER — BUPROPION HCL ER (XL) 300 MG PO TB24: 300.0000 mg | ORAL_TABLET | Freq: Every day | ORAL | Status: DC

## 2013-12-16 NOTE — Progress Notes (Signed)
Patient ID: Kendra BillsRochelle H Carroll, female   DOB: 02/15/1971, 43 y.o.   MRN: [id number] Patient ID: Kendra BillsRochelle H Carroll, female   DOB: 11/07/1970, 43 y.o.   MRN: [id number] Patient ID: Kendra BillsRochelle H Carroll, female   DOB: 12/18/1970, 43 y.o.   MRN: [id number] Patient ID: Kendra BillsRochelle H Carroll, female   DOB: 01/14/1971, 43 y.o.   MRN: [id number] [facility] [id number] Progress Note Kendra BillsRochelle H Carroll MRN: [id number] DOB: 05/09/1971 Age: 43 y.o.  Date: 12/16/2013  Chief Complaint: Chief Complaint  Patient presents with  . Depression  . Manic Behavior  . Follow-up   History of present illness. This patient is a 43 year old married white female lives with her [place] in [place]. 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 She has a 44 year old [place].  The patient states that at age 43 she started out being depressed but eventually had periods of mania. She would get very high and productive and then crash. In 2003 the family lost everything financially. Part of this was her fault because she was spending money without bounds. She's been $9000 in one week and for example.  Her family doctor diagnosed her with bipolar disorder. He worked in conjunction with a Veterinary surgeoncounselor and the counselor made recommendations for medications. The patient ended up on high doses of Seroquel antidepressants and clonazepam. She gained up to 300 pounds and was placed on progesterone because she was not having her period. At this point she overuses clonazepam became extremely confused and overdosed on all her medications. She ended up in the behavioral health hospital in 2006. Her medications were reorganized and she has done much better ever since.  Patient returns after 3 months. She's doing very well. Her mood is good. She's not had depressive or manic symptoms.. she has had some swelling in her left hand and also left leg. Her last lithium level was good at 1.0. Her family doctor can't figure out what the swelling  is from. She has lost about 10 pounds just by cutting most of the sugar out of her diet. Her mood is very stable and she denies any thoughts of self-harm Current psychiatric medication Wellbutrin XL 300 mg daily Lithium carbonate for 450 mg 2 at bedtime   Past psychiatric history Patient has been seeing in this office since November 2006.  She was discharged from behavioral Center.  She was admitted due to serious suicidal attempt she overdosed on pills and require respirator.  Patient told that she was taking progesterone for her breakthrough bleeding for 10 days and became very manic.  However patient has a history of bipolar disorder since 1995.  [facility] .  In the past she has taken Prozac Depakote Neurontin Geodon and Klonopin.  She has a history of manic symptoms.  Family history family history includes Alcohol abuse in her brother and father; Dementia in her maternal grandmother and mother; Depression in her mother; Drug abuse in her brother. There is no history of ADD / ADHD, Anxiety disorder, Bipolar disorder, OCD, Physical abuse, Paranoid behavior, Schizophrenia, Seizures, or Sexual abuse.  Psychosocial history Patient lives with her husband.  She has one daughter.  Patient denies any history of physical sexual verbal abuse.  Her husband is very supportive.    Medical history Arthritis, GERD, polycystic ovary, stress incontinence .  She has recent knee replacement.  Mental status examination Patient is casually dressed and well groomed.  She is pleasant and cooperative.  She maintained good eye contact.  Her speech is clear and coherent.  Her thought process is logical.  There were no flight of ideas or any loose association.  She denies any active or passive suicidal thoughts or homicidal thoughts.  There were no delusion or psychotic symptoms present at this time.  She's alert and oriented x3.  Her psychomotor activity is normal.  She described  her mood is good and her affect is mood appropriate.  Her insight judgment and impulse control is okay.  Assessment Axis I Bipolar disorder Axis II deferred Axis III GERD, arthritis polycystic ovary, stress incontinence Axis IV mild to moderate Axis V 60-65  Plan/Discussion: Recommend to continue current psychiatric medication. Lithium level is therapeutic and all other labs were normal  Risk and benefit discussed in detail.  Recommend to call us back if she is any question or concern.  Followup in 3 months. She'll get another lithium level prior to return   MEDICATIONS this encounter: Meds ordered this encounter  Medications  . lithium carbonate (ESKALITH) 450 MG CR tablet    Sig: Take 2 tablets (900 mg total) by mouth at bedtime.    Dispense:  180 tablet    Refill:  0    90 day supply  . buPROPion (WELLBUTRIN XL) 300 MG 24 hr tablet    Sig: Take 1 tablet (300 mg total) by mouth daily with breakfast.    Dispense:  90 tablet    Refill:  0    90 day supply    Medical Decision Making Problem Points:  Established problem, stable/improving (1), Review of last therapy session (1) and Review of psycho-social stressors (1) Data Points:  Review or order clinical lab tests (1) Review of medication regiment & side effects (2)  I certify that outpatient services furnished can reasonably be expected to improve the patient's condition.   Diannia Ruder, MD,

## 2014-03-18 ENCOUNTER — Encounter (HOSPITAL_COMMUNITY): Payer: Self-pay | Admitting: Psychiatry

## 2014-03-18 ENCOUNTER — Ambulatory Visit (INDEPENDENT_AMBULATORY_CARE_PROVIDER_SITE_OTHER): Payer: BC Managed Care – PPO | Admitting: Psychiatry

## 2014-03-18 VITALS — BP 188/83 | HR 84 | Ht 67.0 in | Wt 209.0 lb

## 2014-03-18 DIAGNOSIS — F319 Bipolar disorder, unspecified: Secondary | ICD-10-CM

## 2014-03-18 MED ORDER — BUPROPION HCL ER (XL) 300 MG PO TB24: 300.0000 mg | ORAL_TABLET | Freq: Every day | ORAL | Status: DC

## 2014-03-18 MED ORDER — SUVOREXANT 15 MG PO TABS: 15.0000 mg | ORAL_TABLET | Freq: Every day | ORAL | Status: DC

## 2014-03-18 MED ORDER — LITHIUM CARBONATE ER 450 MG PO TBCR: 900.0000 mg | EXTENDED_RELEASE_TABLET | Freq: Every day | ORAL | Status: DC

## 2014-03-18 NOTE — Progress Notes (Signed)
Patient ID: [name], female   DOB: 04/17/1971, 43 y.o.   MRN: [id number] Patient ID: [name], female   DOB: 05/10/1971, 43 y.o.   MRN: [id number] Patient ID: [name], female   DOB: 06/22/1970, 43 y.o.   MRN: [id number] Patient ID: [name], female   DOB: 01/06/1971, 43 y.o.   MRN: [id number] Patient ID: [name], female   DOB: 11/01/1970, 43 y.o.   MRN: [id number] [facility] [id number] Progress Note [name] MRN: [id number] DOB: 01/13/1971 Age: 43 y.o.  Date: 03/18/2014  Chief Complaint: Chief Complaint  Patient presents with  . Anxiety  . Depression  . Follow-up  . Manic Behavior   History of present illness. This patient is a 43 year old married white female lives with her husband in [place]. 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 She has a 44 year old daughter lives in [place].  The patient states that at age 43 she started out being depressed but eventually had periods of mania. She would get very high and productive and then crash. In 2003 the family lost everything financially. Part of this was her fault because she was spending money without bounds. She's been $9000 in one week and for example.  Her family doctor diagnosed her with bipolar disorder. He worked in conjunction with a Veterinary surgeoncounselor and the counselor made recommendations for medications. The patient ended up on high doses of Seroquel antidepressants and clonazepam. She gained up to 300 pounds and was placed on progesterone because she was not having her period. At this point she overuses clonazepam became extremely confused and overdosed on all her medications. She ended up in the behavioral health hospital in 2006. Her medications were reorganized and she has done much better ever since.  Patient returns after 3 months.  She is here with her husband. She states that he is losing his job at [employer] because [employer] is closing. On the positive side he will  be getting a new job at the [employer] in [place]. They are going to be moving to [place] very shortly. She will be able to keep her job and do most of her work remotely. She states that her husband has to travel for a bad area to get to work and he is going to have a concealed carry license. She has to fight any opposition to her having her own handgun and at this point I do not as she's been very stable. Despite the new stressors her mood is good and she is very upbeat and positive. She is having trouble sleeping and I suggested she try Belsomra Current psychiatric medication Wellbutrin XL 300 mg daily Lithium carbonate for 450 mg 2 at bedtime   Past psychiatric history Patient has been seeing in this office since November 2006.  She was discharged from behavioral Center.  She was admitted due to serious suicidal attempt she overdosed on pills and require respirator.  Patient told that she was taking progesterone for her breakthrough bleeding for 10 days and became very manic.  However patient has a history of bipolar disorder since 1995.  [facility] .  In the past she has taken Prozac Depakote Neurontin Geodon and Klonopin.  She has a history of manic symptoms.  Family history family history includes Alcohol abuse in her brother and father; Dementia in her maternal grandmother and mother; Depression in her mother; Drug abuse in her brother. There is  no history of ADD / ADHD, Anxiety disorder, Bipolar disorder, OCD, Physical abuse, Paranoid behavior, Schizophrenia, Seizures, or Sexual abuse.  Psychosocial history Patient lives with her husband.  She has one daughter.  Patient denies any history of physical sexual verbal abuse.  Her husband is very supportive.    Medical history Arthritis, GERD, polycystic ovary, stress incontinence .  She has recent knee replacement.  Mental status examination Patient is casually dressed and well groomed.  She is  pleasant and cooperative.  She maintained good eye contact.  Her speech is clear and coherent.  Her thought process is logical.  There were no flight of ideas or any loose association.  She denies any active or passive suicidal thoughts or homicidal thoughts.  There were no delusion or psychotic symptoms present at this time.  She's alert and oriented x3.  Her psychomotor activity is normal.  She described her mood is good and her affect is mood appropriate.  Her insight judgment and impulse control is okay.  Assessment Axis I Bipolar disorder Axis II deferred Axis III GERD, arthritis polycystic ovary, stress incontinence Axis IV mild to moderate Axis V 60-65  Plan/Discussion: Recommend to continue current psychiatric medication. We'll recheck her lithium level and basic metabolic panel  Risk and benefit discussed in detail.  Recommend to call us back if she is any question or concern.  Followup in 3 months. When she moves we will be also sent her records and ask provider  MEDICATIONS this encounter: Meds ordered this encounter  Medications  . buPROPion (WELLBUTRIN XL) 300 MG 24 hr tablet    Sig: Take 1 tablet (300 mg total) by mouth daily with breakfast.    Dispense:  90 tablet    Refill:  2    90 day supply  . lithium carbonate (ESKALITH) 450 MG CR tablet    Sig: Take 2 tablets (900 mg total) by mouth at bedtime.    Dispense:  180 tablet    Refill:  2    90 day supply  . Suvorexant (BELSOMRA) 15 MG TABS    Sig: Take 15 mg by mouth at bedtime.    Dispense:  30 tablet    Refill:  2    Medical Decision Making Problem Points:  Established problem, stable/improving (1), Review of last therapy session (1) and Review of psycho-social stressors (1) Data Points:  Review or order clinical lab tests (1) Review of medication regiment & side effects (2)  I certify that outpatient services furnished can reasonably be expected to improve the patient's condition.   Diannia RuderOSS, Ammarie Matsuura,  MD,

## 2014-06-07 ENCOUNTER — Other Ambulatory Visit (HOSPITAL_COMMUNITY): Payer: Self-pay | Admitting: Psychiatry

## 2014-06-07 ENCOUNTER — Telehealth (HOSPITAL_COMMUNITY): Payer: Self-pay | Admitting: *Deleted

## 2014-06-07 DIAGNOSIS — F319 Bipolar disorder, unspecified: Secondary | ICD-10-CM

## 2014-06-07 MED ORDER — LITHIUM CARBONATE ER 450 MG PO TBCR: 900.0000 mg | EXTENDED_RELEASE_TABLET | Freq: Every day | ORAL | Status: AC

## 2014-06-07 MED ORDER — BUPROPION HCL ER (XL) 300 MG PO TB24: 300.0000 mg | ORAL_TABLET | Freq: Every day | ORAL | Status: AC

## 2014-06-07 NOTE — Telephone Encounter (Signed)
printed

## 2014-06-07 NOTE — Telephone Encounter (Signed)
Pt calling stating they just received [place] this [place] 06[place]10-14 due to work relocating her family. Per pt she would need a printed script of her Lithium and Bupropion. Pt number is [phone].

## 2014-06-07 NOTE — Telephone Encounter (Signed)
lmtcb

## 2014-06-07 NOTE — Telephone Encounter (Signed)
Pt is aware that her script is ready for pick up. Pt shows understanding

## 2014-06-16 ENCOUNTER — Ambulatory Visit (HOSPITAL_COMMUNITY): Payer: Self-pay | Admitting: Psychiatry

## 2017-02-05 ENCOUNTER — Ambulatory Visit: Payer: Self-pay | Admitting: Physician Assistant

## 2017-02-19 ENCOUNTER — Ambulatory Visit
Admission: EM | Admit: 2017-02-19 | Discharge: 2017-02-19 | Disposition: A | Discharge status: Home / Self Care | Payer: 59 | Attending: Family Medicine | Admitting: Family Medicine

## 2017-02-19 DIAGNOSIS — R197 Diarrhea, unspecified: Secondary | ICD-10-CM

## 2017-02-19 DIAGNOSIS — T6291XA Toxic effect of unspecified noxious substance eaten as food, accidental (unintentional), initial encounter: Secondary | ICD-10-CM

## 2017-02-19 DIAGNOSIS — R112 Nausea with vomiting, unspecified: Secondary | ICD-10-CM | POA: Diagnosis not present

## 2017-02-19 DIAGNOSIS — IMO0001 Reserved for inherently not codable concepts without codable children: Secondary | ICD-10-CM

## 2017-02-19 LAB — BASIC METABOLIC PANEL
ANION GAP: 10 (ref 5–15)
BUN: 9 mg/dL (ref 6–20)
CALCIUM: 9.2 mg/dL (ref 8.9–10.3)
CHLORIDE: 112 mmol/L — AB (ref 101–111)
CO2: 19 mmol/L — AB (ref 22–32)
Creatinine, Ser: 0.91 mg/dL (ref 0.44–1.00)
GFR calc non Af Amer: >60 mL/min (ref >60)
Glucose, Bld: 102 mg/dL — ABNORMAL HIGH (ref 65–99)
POTASSIUM: 3.4 mmol/L — AB (ref 3.5–5.1)
Sodium: 141 mmol/L (ref 135–145)

## 2017-02-19 MED ORDER — ONDANSETRON 8 MG PO TBDP: 8.0000 mg | ORAL_TABLET | Freq: Three times a day (TID) | ORAL | 0 refills | Status: DC | PRN

## 2017-02-19 MED ORDER — ONDANSETRON 8 MG PO TBDP
8.0000 mg | ORAL_TABLET | Freq: Once | ORAL | Status: AC
  Administered 2017-02-19: 8 mg via ORAL

## 2017-02-19 NOTE — ED Triage Notes (Signed)
Patient states that Monday evening that she ate leftover [place] food. At 4am Tuesday morning she went to [facility] ER and was diagnosed with food poisioning. Patient states that the last time she vomited was last night around 11pm and last diarrhea episode was at 3am. Patient states that she is concerned that she could need fluids since having symptoms.

## 2017-02-19 NOTE — Discharge Instructions (Signed)
Clear liquids (water, gatorade, broth) for the next 24 hours, then advance diet very slowly as tolerated Over the counter Imodium AD as needed

## 2017-02-19 NOTE — ED Provider Notes (Signed)
[facility]    CSN: [id number] Arrival date & time: 02/19/17  0849     History   Chief Complaint Chief Complaint  Patient presents with  . Abdominal Pain    HPI [name] is a 46 y.o. female.   46 yo female with a 2 days h/o nausea, vomiting and diarrhea since eating left over [place] food. Was seen at [facility] ED at 2am yesterday (Tuesday) and given IVF. Patient states she felt better and last night ate noodle soup but subsequently started vomiting and having diarrhea again. Denies any melena, hematochezia, hematemesis, fevers, chills.    The history is provided by the patient.  Abdominal Pain    Past Medical History:  Diagnosis Date  . Arthritis   . Bipolar disorder (HCC)   . Depression   . GERD (gastroesophageal reflux disease)   . Headache(784.0)    migraines-  "stress and hormonal"  . Mental disorder    bipolar/ LOV Dr Lolly Mustache  LOV 4/13 EPIC  . PCO (polycystic ovaries)   . Sleep apnea    no CPAP after 2007 after 150 lbs weight loss  . Stress incontinence, female     Patient Active Problem List   Diagnosis Date Noted  . Expected Postop Blood loss anemia 02/28/2012  . Postop Hypokalemia 02/28/2012  . OA (osteoarthritis) of knee 02/26/2012  . KNEE, ARTHRITIS, DEGEN./OSTEO 08/23/2009  . PAIN IN JOINT, MULTIPLE SITES 08/23/2009  . HAND PAIN, BILATERAL 01/24/2009  . UPPER RESPIRATORY INFECTION, VIRAL 06/15/2008  . HYPERGLYCEMIA 03/25/2008  . H/O tobacco use, presenting hazards to health 06/10/2007  . LOW BACK PAIN 06/10/2007  . BRONCHITIS, ACUTE WITH BRONCHOSPASM 03/23/2007  . HIRSUTISM 09/19/2006  . BACK PAIN 09/19/2006  . POLYCYSTIC OVARIES 05/31/2006  . OBESITY NOS 05/31/2006  . DISORDER, BIPOLAR NOS 05/31/2006  . ANXIETY 05/31/2006  . DEPRESSION 05/31/2006  . MIGRAINE HEADACHE 05/31/2006  . ALLERGIC RHINITIS 05/31/2006  . ASTHMA 05/31/2006  . GERD 05/31/2006  . URINARY INCONTINENCE 05/31/2006  . DRUG OVERDOSE 05/31/2006    Past  Surgical History:  Procedure Laterality Date  . NO PAST SURGERIES    . TOTAL KNEE ARTHROPLASTY  02/26/2012   Procedure: TOTAL KNEE ARTHROPLASTY;  Surgeon: Loanne Drilling, MD;  Location: [facility];  Service: Orthopedics;  Laterality: Right;    OB History    No data available       Home Medications    Prior to Admission medications   Medication Sig Start Date End Date Taking? Authorizing Provider  buPROPion (WELLBUTRIN XL) 300 MG 24 hr tablet Take 1 tablet (300 mg total) by mouth daily with breakfast. 06/07/14  Yes Myrlene Broker, MD  lithium carbonate (ESKALITH) 450 MG CR tablet Take 2 tablets (900 mg total) by mouth at bedtime. 06/07/14  Yes Myrlene Broker, MD  omeprazole (PRILOSEC) 20 MG capsule Take 20 mg by mouth daily.   Yes [provider]  oxybutynin (DITROPAN) 5 MG tablet  12/25/12  Yes [provider]  cetirizine (ZYRTEC) 10 MG tablet Take 10 mg by mouth daily.    [provider]  ondansetron (ZOFRAN ODT) 8 MG disintegrating tablet Take 1 tablet (8 mg total) by mouth every 8 (eight) hours as needed. 02/19/17   Payton Mccallum, MD  pantoprazole (PROTONIX) 40 MG tablet Take 40 mg by mouth 2 (two) times daily.  06/09/11   [provider]  Suvorexant (BELSOMRA) 15 MG TABS Take 15 mg by mouth at bedtime. 03/18/14   Tenny Craw,  Mellody Drown, MD    Family History Family History  Problem Relation Age of Onset  . Dementia Mother   . Depression Mother   . Alcohol abuse Father   . Alcohol abuse Brother   . Drug abuse Brother   . Dementia Maternal Grandmother   . ADD / ADHD Neg Hx   . Anxiety disorder Neg Hx   . Bipolar disorder Neg Hx   . OCD Neg Hx   . Physical abuse Neg Hx   . Paranoid behavior Neg Hx   . Schizophrenia Neg Hx   . Seizures Neg Hx   . Sexual abuse Neg Hx     Social History Social History  Substance Use Topics  . Smoking status: Former Smoker    Quit date: 06/11/2009  . Smokeless tobacco: Never Used  . Alcohol use No      Allergies   Klonopin [clonazepam]; Cefaclor; Milk-related compounds; and Penicillins   Review of Systems Review of Systems  Gastrointestinal: Positive for abdominal pain.     Physical Exam Triage Vital Signs ED Triage Vitals  Enc Vitals Group     BP 02/19/17 0909 111/78     Pulse Rate 02/19/17 0909 72     Resp 02/19/17 0909 18     Temp 02/19/17 0909 98.1 F (36.7 C)     Temp Source 02/19/17 0909 Oral     SpO2 02/19/17 0909 99 %     Weight 02/19/17 0906 185 lb (83.9 kg)     Height 02/19/17 0906  (1.702 m)     Head Circumference --      Peak Flow --      Pain Score 02/19/17 0906 0     Pain Loc --      Pain Edu? --      Excl. in GC? --    No data found.   Updated Vital Signs BP 111/78 (BP Location: Left Arm)   Pulse 72   Temp 98.1 F (36.7 C) (Oral)   Resp 18   Ht  (1.702 m)   Wt 185 lb (83.9 kg)   LMP 02/16/2017   SpO2 99%   BMI 28.98 kg/m   Visual Acuity Right Eye Distance:   Left Eye Distance:   Bilateral Distance:    Right Eye Near:   Left Eye Near:    Bilateral Near:     Physical Exam  Constitutional: She appears well-developed and well-nourished. No distress.  Abdominal: Soft. Bowel sounds are normal. She exhibits no distension and no mass. There is tenderness. There is no rebound and no guarding (mild, diffuse; no rebound or guarding).  Skin: She is not diaphoretic.  Nursing note and vitals reviewed.    UC Treatments / Results  Labs (all labs ordered are listed, but only abnormal results are displayed) Labs Reviewed  BASIC METABOLIC PANEL - Abnormal; Notable for the following:       Result Value   Potassium 3.4 (*)    Chloride 112 (*)    CO2 19 (*)    Glucose, Bld 102 (*)    All other components within normal limits    EKG  EKG Interpretation None       Radiology No results found.  Procedures Procedures (including critical care time)  Medications Ordered in UC Medications  ondansetron (ZOFRAN-ODT)  disintegrating tablet 8 mg (8 mg Oral Given 02/19/17 1014)     Initial Impression / Assessment and Plan / UC Course  I have reviewed the  triage vital signs and the nursing notes.  Pertinent labs & imaging results that were available during my care of the patient were reviewed by me and considered in my medical decision making (see chart for details).       Final Clinical Impressions(s) / UC Diagnoses   Final diagnoses:  Nausea vomiting and diarrhea  Food poisoning, accidental or unintentional, initial encounter    New Prescriptions Discharge Medication List as of 02/19/2017 11:18 AM    START taking these medications   Details  ondansetron (ZOFRAN ODT) 8 MG disintegrating tablet Take 1 tablet (8 mg total) by mouth every 8 (eight) hours as needed., Starting Wed 02/19/2017, Normal       1. Lab results and diagnosis reviewed with patient 2. rx as per orders above; reviewed possible side effects, interactions, risks and benefits  3. Recommend supportive treatment with clear liquids then advance slowly as tolerated 4. Follow-up prn if symptoms worsen or don't improve  Controlled Substance Prescriptions [place] Controlled Substance Registry consulted? Not Applicable   Payton Mccallum, MD 02/19/17 1318

## 2017-02-22 HISTORY — PX: CHOLECYSTECTOMY: SHX55

## 2017-05-14 DIAGNOSIS — F4320 Adjustment disorder, unspecified: Secondary | ICD-10-CM | POA: Diagnosis not present

## 2017-05-30 DIAGNOSIS — N200 Calculus of kidney: Secondary | ICD-10-CM | POA: Diagnosis not present

## 2017-05-30 DIAGNOSIS — N390 Urinary tract infection, site not specified: Secondary | ICD-10-CM | POA: Diagnosis not present

## 2017-05-30 DIAGNOSIS — F4320 Adjustment disorder, unspecified: Secondary | ICD-10-CM | POA: Diagnosis not present

## 2017-05-30 DIAGNOSIS — R1031 Right lower quadrant pain: Secondary | ICD-10-CM | POA: Diagnosis not present

## 2017-05-30 DIAGNOSIS — N888 Other specified noninflammatory disorders of cervix uteri: Secondary | ICD-10-CM | POA: Diagnosis not present

## 2017-05-30 DIAGNOSIS — R935 Abnormal findings on diagnostic imaging of other abdominal regions, including retroperitoneum: Secondary | ICD-10-CM | POA: Diagnosis not present

## 2017-05-30 DIAGNOSIS — Z9049 Acquired absence of other specified parts of digestive tract: Secondary | ICD-10-CM | POA: Diagnosis not present

## 2017-06-06 DIAGNOSIS — F4320 Adjustment disorder, unspecified: Secondary | ICD-10-CM | POA: Diagnosis not present

## 2017-06-20 DIAGNOSIS — F319 Bipolar disorder, unspecified: Secondary | ICD-10-CM | POA: Diagnosis not present

## 2017-06-20 DIAGNOSIS — Z0000 Encounter for general adult medical examination without abnormal findings: Secondary | ICD-10-CM | POA: Diagnosis not present

## 2017-06-20 DIAGNOSIS — Z1329 Encounter for screening for other suspected endocrine disorder: Secondary | ICD-10-CM | POA: Diagnosis not present

## 2017-06-20 DIAGNOSIS — Z5181 Encounter for therapeutic drug level monitoring: Secondary | ICD-10-CM | POA: Diagnosis not present

## 2017-06-30 DIAGNOSIS — F4320 Adjustment disorder, unspecified: Secondary | ICD-10-CM | POA: Diagnosis not present

## 2017-07-04 DIAGNOSIS — F4320 Adjustment disorder, unspecified: Secondary | ICD-10-CM | POA: Diagnosis not present

## 2017-07-07 ENCOUNTER — Other Ambulatory Visit: Payer: Self-pay | Admitting: Internal Medicine

## 2017-07-08 ENCOUNTER — Encounter: Payer: Self-pay | Admitting: Internal Medicine

## 2017-07-08 ENCOUNTER — Ambulatory Visit (INDEPENDENT_AMBULATORY_CARE_PROVIDER_SITE_OTHER): Payer: BLUE CROSS/BLUE SHIELD | Admitting: Internal Medicine

## 2017-07-08 VITALS — BP 128/78 | HR 68 | Ht 67.0 in | Wt 190.0 lb

## 2017-07-08 DIAGNOSIS — Z30011 Encounter for initial prescription of contraceptive pills: Secondary | ICD-10-CM

## 2017-07-08 DIAGNOSIS — E282 Polycystic ovarian syndrome: Secondary | ICD-10-CM | POA: Diagnosis not present

## 2017-07-08 DIAGNOSIS — J3089 Other allergic rhinitis: Secondary | ICD-10-CM | POA: Diagnosis not present

## 2017-07-08 DIAGNOSIS — F319 Bipolar disorder, unspecified: Secondary | ICD-10-CM | POA: Diagnosis not present

## 2017-07-08 DIAGNOSIS — K219 Gastro-esophageal reflux disease without esophagitis: Secondary | ICD-10-CM

## 2017-07-08 MED ORDER — LEVONORGEST-ETH ESTRAD 91-DAY 0.1-0.02 & 0.01 MG PO TABS: 1.0000 | ORAL_TABLET | Freq: Every day | ORAL | 4 refills | Status: AC

## 2017-07-08 MED ORDER — CETIRIZINE HCL 10 MG PO TABS: 10.0000 mg | ORAL_TABLET | Freq: Every day | ORAL | 3 refills | Status: AC

## 2017-07-08 MED ORDER — PANTOPRAZOLE SODIUM 40 MG PO TBEC: 40.0000 mg | DELAYED_RELEASE_TABLET | Freq: Two times a day (BID) | ORAL | 3 refills | Status: AC

## 2017-07-08 NOTE — Patient Instructions (Signed)

## 2017-07-08 NOTE — Progress Notes (Signed)
Date:  07/08/2017   Name:  [name]   DOB:  02/06/1971   MRN:  [id number]   Chief Complaint: Establish Care; Gastroesophageal Reflux (Refills on medication. Protonix, and zyrtec. ); and Contraception (Would like to discuss oral medication. Has taking Depo in past. Getting it from [facility] in [place]. )  Gastroesophageal Reflux  She complains of heartburn. She reports no abdominal pain, no chest pain, no coughing or no wheezing. This is a chronic problem. The current episode started more than 1 year ago. The problem occurs frequently. Pertinent negatives include no fatigue. She has tried a PPI for the symptoms. The treatment provided significant relief. Past procedures include an EGD.  Allergic rhinnitis - maintained on zyrtec daily. She also has recurrent sinus infections.  Contraception - recently started on Depo Provera.  Has had 2 injections and they are making her emotionally labile. She would like to go back on oral contraceptives. She is sexually active and does not use condoms. She has taken OCPs in the past without side effects.  She is a past smoker and has no history of DVT or CVA.  Bipolar disorder/depression - she has a long history of depression and bipolar illness.  At one time she was taking multiple medications and ultimately overdosed on prescription medications. Since then has been under the care of a psychiatrist and has done well.  She feels that her sx are well controlled at this time.  Her weight is stable - highest weight at one was over 300 lbs.  Review of Systems  Constitutional: Negative for chills, fatigue, fever and unexpected weight change.  Eyes: Positive for visual disturbance.  Respiratory: Negative for cough, chest tightness, shortness of breath and wheezing.   Cardiovascular: Negative for chest pain and palpitations.  Gastrointestinal: Positive for heartburn. Negative for abdominal pain, diarrhea and vomiting.  Genitourinary: Positive for menstrual  problem.  Allergic/Immunologic: Positive for environmental allergies.  Neurological: Negative for dizziness, speech difficulty and headaches.    Patient Active Problem List   Diagnosis Date Noted  . Expected Postop Blood loss anemia 02/28/2012  . Postop Hypokalemia 02/28/2012  . OA (osteoarthritis) of knee 02/26/2012  . KNEE, ARTHRITIS, DEGEN./OSTEO 08/23/2009  . PAIN IN JOINT, MULTIPLE SITES 08/23/2009  . H/O tobacco use, presenting hazards to health 06/10/2007  . HIRSUTISM 09/19/2006  . BACK PAIN 09/19/2006  . POLYCYSTIC OVARIES 05/31/2006  . DISORDER, BIPOLAR NOS 05/31/2006  . ANXIETY 05/31/2006  . DEPRESSION 05/31/2006  . MIGRAINE HEADACHE 05/31/2006  . ALLERGIC RHINITIS 05/31/2006  . GERD 05/31/2006  . URINARY INCONTINENCE 05/31/2006  . DRUG OVERDOSE 05/31/2006    Prior to Admission medications   Medication Sig Start Date End Date Taking? Authorizing Provider  buPROPion (WELLBUTRIN XL) 300 MG 24 hr tablet Take 1 tablet (300 mg total) by mouth daily with breakfast. 06/07/14  Yes Myrlene Broker, MD  lithium carbonate (ESKALITH) 450 MG CR tablet Take 2 tablets (900 mg total) by mouth at bedtime. 06/07/14  Yes Myrlene Broker, MD  pantoprazole (PROTONIX) 40 MG tablet Take 40 mg by mouth 2 (two) times daily.  06/09/11  Yes [provider]  traZODone (DESYREL) 50 MG tablet Take 100 mg by mouth at bedtime.   Yes [provider]  cetirizine (ZYRTEC) 10 MG tablet Take 10 mg by mouth daily.    [provider]    Allergies  Allergen Reactions  . Klonopin [Clonazepam] Anaphylaxis    Required hospitalization   And psychiatric care  .  Cefaclor     REACTION: drowsy  . Milk-Related Compounds Diarrhea, Nausea And Vomiting and Other (See Comments)    gas  . Penicillins     REACTION: itchy/rash    Past Surgical History:  Procedure Laterality Date  . CHOLECYSTECTOMY  02/22/2017  . NO PAST SURGERIES    . TOTAL KNEE ARTHROPLASTY  02/26/2012   Procedure:  TOTAL KNEE ARTHROPLASTY;  Surgeon: Loanne DrillingFrank V Aluisio, MD;  Location: [facility];  Service: Orthopedics;  Laterality: Right;    Social History   Tobacco Use  . Smoking status: Former Smoker    Last attempt to quit: 06/11/2009    Years since quitting: 8.0  . Smokeless tobacco: Never Used  Substance Use Topics  . Alcohol use: No  . Drug use: No     Medication list has been reviewed and updated.  No flowsheet data found.  Physical Exam  Constitutional: She is oriented to person, place, and time. She appears well-developed. No distress.  HENT:  Head: Normocephalic and atraumatic.  Neck: Normal range of motion. Neck supple.  Cardiovascular: Normal rate, regular rhythm and normal heart sounds.  Pulmonary/Chest: Effort normal and breath sounds normal. No respiratory distress. She has no wheezes.  Musculoskeletal: Normal range of motion. She exhibits no edema or tenderness.  Neurological: She is alert and oriented to person, place, and time.  Skin: Skin is warm and dry. No rash noted.  Psychiatric: She has a normal mood and affect. Her behavior is normal. Thought content normal.  Nursing note and vitals reviewed.   BP 128/78   Pulse 68   Ht 5\' 7"  (1.702 m)   Wt 190 lb (86.2 kg)   SpO2 98%   BMI 29.76 kg/m   Assessment and Plan: 1. Bipolar affective disorder, remission status unspecified (HCC) Continue under care of psychiatry  2. Environmental and seasonal allergies - cetirizine (ZYRTEC) 10 MG tablet; Take 1 tablet (10 mg total) by mouth daily.  Dispense: 90 tablet; Refill: 3  3. Gastroesophageal reflux disease without esophagitis Controlled on PPI - pantoprazole (PROTONIX) 40 MG tablet; Take 1 tablet (40 mg total) by mouth 2 (two) times daily.  Dispense: 90 tablet; Refill: 3  4. Polycystic disease, ovaries Has not had labs or Pap in a number of years  5. Encounter for initial prescription of contraceptive pills Will discontinue DepoProvera Begin extended cycle OCP -  Levonorgestrel-Ethinyl Estradiol (AMETHIA,CAMRESE) 0.1-0.02 & 0.01 MG tablet; Take 1 tablet by mouth daily.  Dispense: 1 Package; Refill: 4   Meds ordered this encounter  Medications  . Levonorgestrel-Ethinyl Estradiol (AMETHIA,CAMRESE) 0.1-0.02 & 0.01 MG tablet    Sig: Take 1 tablet by mouth daily.    Dispense:  1 Package    Refill:  4  . pantoprazole (PROTONIX) 40 MG tablet    Sig: Take 1 tablet (40 mg total) by mouth 2 (two) times daily.    Dispense:  90 tablet    Refill:  3  . cetirizine (ZYRTEC) 10 MG tablet    Sig: Take 1 tablet (10 mg total) by mouth daily.    Dispense:  90 tablet    Refill:  3    Partially dictated using Animal nutritionistDragon software. Any errors are unintentional.  Bari EdwardLaura Feather Berrie, MD [facility]  07/08/2017

## 2017-07-11 DIAGNOSIS — F4320 Adjustment disorder, unspecified: Secondary | ICD-10-CM | POA: Diagnosis not present

## 2017-07-18 DIAGNOSIS — F4320 Adjustment disorder, unspecified: Secondary | ICD-10-CM | POA: Diagnosis not present

## 2017-08-01 DIAGNOSIS — F4320 Adjustment disorder, unspecified: Secondary | ICD-10-CM | POA: Diagnosis not present

## 2017-08-08 DIAGNOSIS — F4320 Adjustment disorder, unspecified: Secondary | ICD-10-CM | POA: Diagnosis not present

## 2017-08-15 DIAGNOSIS — F4321 Adjustment disorder with depressed mood: Secondary | ICD-10-CM | POA: Diagnosis not present

## 2017-08-29 DIAGNOSIS — F4321 Adjustment disorder with depressed mood: Secondary | ICD-10-CM | POA: Diagnosis not present

## 2017-09-05 DIAGNOSIS — F4321 Adjustment disorder with depressed mood: Secondary | ICD-10-CM | POA: Diagnosis not present

## 2017-09-12 DIAGNOSIS — F4321 Adjustment disorder with depressed mood: Secondary | ICD-10-CM | POA: Diagnosis not present

## 2017-09-15 DIAGNOSIS — Z113 Encounter for screening for infections with a predominantly sexual mode of transmission: Secondary | ICD-10-CM | POA: Diagnosis not present

## 2017-10-03 DIAGNOSIS — F4321 Adjustment disorder with depressed mood: Secondary | ICD-10-CM | POA: Diagnosis not present

## 2017-10-27 ENCOUNTER — Ambulatory Visit
Admission: EM | Admit: 2017-10-27 | Discharge: 2017-10-27 | Disposition: A | Discharge status: Home / Self Care | Payer: BLUE CROSS/BLUE SHIELD | Attending: Family Medicine | Admitting: Family Medicine

## 2017-10-27 ENCOUNTER — Encounter: Payer: Self-pay | Admitting: Emergency Medicine

## 2017-10-27 ENCOUNTER — Other Ambulatory Visit: Payer: Self-pay

## 2017-10-27 ENCOUNTER — Ambulatory Visit: Payer: BLUE CROSS/BLUE SHIELD | Admitting: Internal Medicine

## 2017-10-27 DIAGNOSIS — S161XXA Strain of muscle, fascia and tendon at neck level, initial encounter: Secondary | ICD-10-CM

## 2017-10-27 DIAGNOSIS — S39012A Strain of muscle, fascia and tendon of lower back, initial encounter: Secondary | ICD-10-CM | POA: Diagnosis not present

## 2017-10-27 DIAGNOSIS — X500XXA Overexertion from strenuous movement or load, initial encounter: Secondary | ICD-10-CM | POA: Diagnosis not present

## 2017-10-27 MED ORDER — CYCLOBENZAPRINE HCL 10 MG PO TABS: 10.0000 mg | ORAL_TABLET | Freq: Every day | ORAL | 0 refills | Status: DC

## 2017-10-27 NOTE — ED Provider Notes (Signed)
[facility]    CSN: [id number] Arrival date & time: 10/27/17  1143     History   Chief Complaint Chief Complaint  Patient presents with  . Back Pain  . Neck Pain    HPI [name] is a 47 y.o. female.   47 yo female with a c/o low back pain and neck pain for the past 3 days after doing some heavy work moving her furniture around her house. Denies any numbness/tingling, fevers, chills.   The history is provided by the patient.  Back Pain  Neck Pain    Past Medical History:  Diagnosis Date  . Arthritis   . Bipolar disorder (HCC)   . Depression   . GERD (gastroesophageal reflux disease)   . Headache(784.0)    migraines-  "stress and hormonal"  . Mental disorder    bipolar/ LOV Dr Lolly MustacheArfeen  LOV 4/13 EPIC  . PCO (polycystic ovaries)   . Sleep apnea    no CPAP after 2007 after 150 lbs weight loss  . Stress incontinence, female     Patient Active Problem List   Diagnosis Date Noted  . OA (osteoarthritis) of knee 02/26/2012  . Hirsutism 09/19/2006  . Polycystic ovaries 05/31/2006  . Anxiety state 05/31/2006  . Migraine 05/31/2006  . Environmental and seasonal allergies 05/31/2006  . GERD (gastroesophageal reflux disease) 05/31/2006  . Urinary incontinence in female 05/31/2006  . History of drug overdose 05/31/2006    Past Surgical History:  Procedure Laterality Date  . CHOLECYSTECTOMY  02/22/2017  . TOTAL KNEE ARTHROPLASTY  02/26/2012   Procedure: TOTAL KNEE ARTHROPLASTY;  Surgeon: Loanne DrillingFrank V Aluisio, MD;  Location: [facility];  Service: Orthopedics;  Laterality: Right;    OB History   None      Home Medications    Prior to Admission medications   Medication Sig Start Date End Date Taking? Authorizing Provider  buPROPion (WELLBUTRIN XL) 300 MG 24 hr tablet Take 1 tablet (300 mg total) by mouth daily with breakfast. 06/07/14  Yes Myrlene Brokeross, Deborah R, MD  cetirizine (ZYRTEC) 10 MG tablet Take 1 tablet (10 mg total) by mouth daily. 07/08/17  Yes  Reubin MilanBerglund, Laura H, MD  Levonorgestrel-Ethinyl Estradiol (AMETHIA,CAMRESE) 0.1-0.02 & 0.01 MG tablet Take 1 tablet by mouth daily. 07/08/17  Yes Reubin MilanBerglund, Laura H, MD  lithium carbonate (ESKALITH) 450 MG CR tablet Take 2 tablets (900 mg total) by mouth at bedtime. 06/07/14  Yes Myrlene Brokeross, Deborah R, MD  pantoprazole (PROTONIX) 40 MG tablet Take 1 tablet (40 mg total) by mouth 2 (two) times daily. 07/08/17  Yes Reubin MilanBerglund, Laura H, MD  traZODone (DESYREL) 50 MG tablet Take 100 mg by mouth at bedtime.   Yes [provider]  cyclobenzaprine (FLEXERIL) 10 MG tablet Take 1 tablet (10 mg total) by mouth at bedtime. PRN 10/27/17   Payton Mccallumonty, Damonte Frieson, MD    Family History Family History  Problem Relation Age of Onset  . Dementia Mother   . Depression Mother   . Alcohol abuse Father   . Alcohol abuse Brother   . Drug abuse Brother   . Dementia Maternal Grandmother   . ADD / ADHD Neg Hx   . Anxiety disorder Neg Hx   . Bipolar disorder Neg Hx   . OCD Neg Hx   . Physical abuse Neg Hx   . Paranoid behavior Neg Hx   . Schizophrenia Neg Hx   . Seizures Neg Hx   . Sexual abuse Neg Hx  Social History Social History   Tobacco Use  . Smoking status: Former Smoker    Last attempt to quit: 06/11/2009    Years since quitting: 8.3  . Smokeless tobacco: Never Used  Substance Use Topics  . Alcohol use: No  . Drug use: No     Allergies   Klonopin [clonazepam]; Cefaclor; Milk-related compounds; and Penicillins   Review of Systems Review of Systems  Musculoskeletal: Positive for back pain and neck pain.     Physical Exam Triage Vital Signs ED Triage Vitals  Enc Vitals Group     BP 10/27/17 1157 116/86     Pulse Rate 10/27/17 1157 72     Resp 10/27/17 1157 16     Temp 10/27/17 1157 98.4 F (36.9 C)     Temp Source 10/27/17 1157 Oral     SpO2 --      Weight 10/27/17 1154 195 lb (88.5 kg)     Height 10/27/17 1154 5\' 7"  (1.702 m)     Head Circumference --      Peak Flow --      Pain  Score 10/27/17 1154 5     Pain Loc --      Pain Edu? --      Excl. in GC? --    No data found.  Updated Vital Signs BP 116/86 (BP Location: Left Arm)   Pulse 72   Temp 98.4 F (36.9 C) (Oral)   Resp 16   Ht 5\' 7"  (1.702 m)   Wt 195 lb (88.5 kg)   LMP 10/13/2017 (Approximate)   BMI 30.54 kg/m   Visual Acuity Right Eye Distance:   Left Eye Distance:   Bilateral Distance:    Right Eye Near:   Left Eye Near:    Bilateral Near:     Physical Exam  Constitutional: She appears well-developed and well-nourished. No distress.  Musculoskeletal: She exhibits tenderness. She exhibits no edema.       Cervical back: She exhibits tenderness (over the right trapezius) and spasm. She exhibits no bony tenderness, no swelling, no edema, no deformity and normal pulse.       Lumbar back: She exhibits tenderness (over the right lumbar paraspinous muscles) and spasm. She exhibits normal range of motion, no bony tenderness, no swelling, no edema, no deformity, no laceration, no pain and normal pulse.  Neurological: She is alert. She has normal reflexes. She displays normal reflexes. She exhibits normal muscle tone.  Skin: Skin is warm and dry. No rash noted. She is not diaphoretic. No erythema.  Nursing note and vitals reviewed.    UC Treatments / Results  Labs (all labs ordered are listed, but only abnormal results are displayed) Labs Reviewed - No data to display  EKG None  Radiology No results found.  Procedures Procedures (including critical care time)  Medications Ordered in UC Medications - No data to display  Initial Impression / Assessment and Plan / UC Course  I have reviewed the triage vital signs and the nursing notes.  Pertinent labs & imaging results that were available during my care of the patient were reviewed by me and considered in my medical decision making (see chart for details).      Final Clinical Impressions(s) / UC Diagnoses   Final diagnoses:    Strain of lumbar region, initial encounter  Strain of neck muscle, initial encounter   Discharge Instructions   None    ED Prescriptions    Medication Sig Dispense Auth. Provider  cyclobenzaprine (FLEXERIL) 10 MG tablet Take 1 tablet (10 mg total) by mouth at bedtime. PRN 15 tablet Payton Mccallum, MD     1. diagnosis reviewed with patient 2. rx as per orders above; reviewed possible side effects, interactions, risks and benefits  3. Recommend supportive treatment with  4. Follow-up prn if symptoms worsen or don't improve   Controlled Substance Prescriptions [place] Controlled Substance Registry consulted? Not Applicable   Payton Mccallum, MD 10/27/17 [phone]

## 2017-10-27 NOTE — ED Triage Notes (Signed)
Patient states that on Saturday she was moving her furniture around and felt pain on the right side of her lower back that goes down her right leg.  Patient also reports pain in her neck that started yesterday.

## 2017-11-03 DIAGNOSIS — Z0000 Encounter for general adult medical examination without abnormal findings: Secondary | ICD-10-CM | POA: Diagnosis not present

## 2017-11-03 DIAGNOSIS — Z79899 Other long term (current) drug therapy: Secondary | ICD-10-CM | POA: Diagnosis not present

## 2017-11-03 DIAGNOSIS — Z5181 Encounter for therapeutic drug level monitoring: Secondary | ICD-10-CM | POA: Diagnosis not present

## 2017-11-03 DIAGNOSIS — Z1329 Encounter for screening for other suspected endocrine disorder: Secondary | ICD-10-CM | POA: Diagnosis not present

## 2017-12-05 DIAGNOSIS — F310 Bipolar disorder, current episode hypomanic: Secondary | ICD-10-CM | POA: Diagnosis not present

## 2017-12-08 DIAGNOSIS — F310 Bipolar disorder, current episode hypomanic: Secondary | ICD-10-CM | POA: Diagnosis not present

## 2017-12-15 ENCOUNTER — Ambulatory Visit (INDEPENDENT_AMBULATORY_CARE_PROVIDER_SITE_OTHER): Payer: BLUE CROSS/BLUE SHIELD

## 2017-12-15 ENCOUNTER — Ambulatory Visit
Admission: EM | Admit: 2017-12-15 | Discharge: 2017-12-15 | Disposition: A | Discharge status: Home / Self Care | Payer: BLUE CROSS/BLUE SHIELD | Attending: Family Medicine | Admitting: Family Medicine

## 2017-12-15 DIAGNOSIS — S8001XA Contusion of right knee, initial encounter: Secondary | ICD-10-CM

## 2017-12-15 DIAGNOSIS — M25561 Pain in right knee: Secondary | ICD-10-CM

## 2017-12-15 DIAGNOSIS — W2209XA Striking against other stationary object, initial encounter: Secondary | ICD-10-CM | POA: Diagnosis not present

## 2017-12-15 DIAGNOSIS — F310 Bipolar disorder, current episode hypomanic: Secondary | ICD-10-CM | POA: Diagnosis not present

## 2017-12-15 DIAGNOSIS — S8991XA Unspecified injury of right lower leg, initial encounter: Secondary | ICD-10-CM | POA: Diagnosis not present

## 2017-12-15 DIAGNOSIS — M7989 Other specified soft tissue disorders: Secondary | ICD-10-CM | POA: Diagnosis not present

## 2017-12-15 MED ORDER — HYDROCODONE-ACETAMINOPHEN 5-325 MG PO TABS: ORAL_TABLET | ORAL | 0 refills | Status: AC

## 2017-12-15 NOTE — Discharge Instructions (Signed)
Follow-up with your orthopedist.  °

## 2017-12-15 NOTE — ED Provider Notes (Signed)
[facility]    CSN: [id number] Arrival date & time: 12/15/17  1633     History   Chief Complaint Chief Complaint  Patient presents with  . Knee Pain    HPI [name] is a 47 y.o. female.   47 yo female with a c/o right knee pain after twisting it and hitting it on the bed of her truck as she was putting groceries in her car at the [employer] parking lot yesterday. Since then has noticed swelling, grinding and some pain. States she had a knee replacement of that knee in 2013.  The history is provided by the patient.  Knee Pain    Past Medical History:  Diagnosis Date  . Arthritis   . Bipolar disorder (HCC)   . Depression   . GERD (gastroesophageal reflux disease)   . Headache(784.0)    migraines-  "stress and hormonal"  . Mental disorder    bipolar/ LOV Dr Lolly Mustache  LOV 4/13 EPIC  . PCO (polycystic ovaries)   . Sleep apnea    no CPAP after 2007 after 150 lbs weight loss  . Stress incontinence, female     Patient Active Problem List   Diagnosis Date Noted  . OA (osteoarthritis) of knee 02/26/2012  . Hirsutism 09/19/2006  . Polycystic ovaries 05/31/2006  . Anxiety state 05/31/2006  . Migraine 05/31/2006  . Environmental and seasonal allergies 05/31/2006  . GERD (gastroesophageal reflux disease) 05/31/2006  . Urinary incontinence in female 05/31/2006  . History of drug overdose 05/31/2006    Past Surgical History:  Procedure Laterality Date  . CHOLECYSTECTOMY  02/22/2017  . TOTAL KNEE ARTHROPLASTY  02/26/2012   Procedure: TOTAL KNEE ARTHROPLASTY;  Surgeon: Loanne Drilling, MD;  Location: [facility];  Service: Orthopedics;  Laterality: Right;    OB History   None      Home Medications    Prior to Admission medications   Medication Sig Start Date End Date Taking? Authorizing Provider  buPROPion (WELLBUTRIN XL) 300 MG 24 hr tablet Take 1 tablet (300 mg total) by mouth daily with breakfast. 06/07/14  Yes Myrlene Broker, MD  cetirizine  (ZYRTEC) 10 MG tablet Take 1 tablet (10 mg total) by mouth daily. 07/08/17  Yes Reubin Milan, MD  cyclobenzaprine (FLEXERIL) 10 MG tablet Take 1 tablet (10 mg total) by mouth at bedtime. PRN 10/27/17  Yes Payton Mccallum, MD  Levonorgestrel-Ethinyl Estradiol (AMETHIA,CAMRESE) 0.1-0.02 & 0.01 MG tablet Take 1 tablet by mouth daily. 07/08/17  Yes Reubin Milan, MD  lithium carbonate (ESKALITH) 450 MG CR tablet Take 2 tablets (900 mg total) by mouth at bedtime. Patient taking differently: Take 1,200 mg by mouth at bedtime.  06/07/14  Yes Myrlene Broker, MD  HYDROcodone-acetaminophen (NORCO/VICODIN) 5-325 MG tablet 1-2 tabs po q 8 hours prn 12/15/17   Payton Mccallum, MD  pantoprazole (PROTONIX) 40 MG tablet Take 1 tablet (40 mg total) by mouth 2 (two) times daily. 07/08/17   Reubin Milan, MD  traZODone (DESYREL) 50 MG tablet Take 100 mg by mouth at bedtime.    [provider]    Family History Family History  Problem Relation Age of Onset  . Dementia Mother   . Depression Mother   . Alcohol abuse Father   . Alcohol abuse Brother   . Drug abuse Brother   . Dementia Maternal Grandmother   . ADD / ADHD Neg Hx   . Anxiety disorder Neg Hx   . Bipolar  disorder Neg Hx   . OCD Neg Hx   . Physical abuse Neg Hx   . Paranoid behavior Neg Hx   . Schizophrenia Neg Hx   . Seizures Neg Hx   . Sexual abuse Neg Hx     Social History Social History   Tobacco Use  . Smoking status: Former Smoker    Last attempt to quit: 06/11/2009    Years since quitting: 8.5  . Smokeless tobacco: Never Used  Substance Use Topics  . Alcohol use: No  . Drug use: No     Allergies   Klonopin [clonazepam]; Cefaclor; Milk-related compounds; and Penicillins   Review of Systems Review of Systems   Physical Exam Triage Vital Signs ED Triage Vitals [12/15/17 1642]  Enc Vitals Group     BP (!) 119/97     Pulse Rate 71     Resp 18     Temp 98 F (36.7 C)     Temp Source Oral     SpO2 100 %       Weight 192 lb 12.8 oz (87.5 kg)     Height      Head Circumference      Peak Flow      Pain Score 0     Pain Loc      Pain Edu?      Excl. in GC?    No data found.  Updated Vital Signs BP (!) 119/97 (BP Location: Left Arm)   Pulse 71   Temp 98 F (36.7 C) (Oral)   Resp 18   Wt 192 lb 12.8 oz (87.5 kg)   LMP 12/01/2017 (Within Weeks) Comment: denies preg  SpO2 100%   BMI 30.20 kg/m   Visual Acuity Right Eye Distance:   Left Eye Distance:   Bilateral Distance:    Right Eye Near:   Left Eye Near:    Bilateral Near:     Physical Exam  Constitutional: She appears well-developed and well-nourished. No distress.  Musculoskeletal:       Right knee: She exhibits swelling (mild, diffuse). She exhibits normal range of motion, no effusion, no ecchymosis, no deformity, no laceration, no erythema, normal alignment, no LCL laxity, normal patellar mobility, normal meniscus and no MCL laxity. Tenderness (diffuse) found.  Skin: She is not diaphoretic.  Nursing note and vitals reviewed.    UC Treatments / Results  Labs (all labs ordered are listed, but only abnormal results are displayed) Labs Reviewed - No data to display  EKG None  Radiology Dg Knee Complete 4 Views Right  Result Date: 12/15/2017 CLINICAL DATA:  Twisting injury to right knee yesterday. Knee pain and swelling. Initial encounter. EXAM: RIGHT KNEE - COMPLETE 4+ VIEW COMPARISON:  None. FINDINGS: No evidence of fracture, dislocation, or joint effusion. Prior total knee arthroplasty is seen with all 3 components in expected position. A chondrous bone lesion is seen in the distal femoral diaphysis which shows no aggressive radiographic features. IMPRESSION: No acute findings. Electronically Signed   By: Myles RosenthalJohn  Stahl M.D.   On: 12/15/2017 18:10    Procedures Procedures (including critical care time)  Medications Ordered in UC Medications - No data to display  Initial Impression / Assessment and Plan / UC  Course  I have reviewed the triage vital signs and the nursing notes.  Pertinent labs & imaging results that were available during my care of the patient were reviewed by me and considered in my medical decision making (see chart for details).  Final Clinical Impressions(s) / UC Diagnoses   Final diagnoses:  Contusion of right knee, initial encounter     Discharge Instructions     Follow up with your orthopedist    ED Prescriptions    Medication Sig Dispense Auth. Provider   HYDROcodone-acetaminophen (NORCO/VICODIN) 5-325 MG tablet 1-2 tabs po q 8 hours prn 8 tablet Keylie Beavers, MD      1. x-ray results and diagnosis reviewed with patient 2. rx as per orders above; reviewed possible side effects, interactions, risks and benefits  3. Recommend supportive treatment with rest, ice, otc analgesics prn 4. Follow-up with orthopedist   Controlled Substance Prescriptions [place] Controlled Substance Registry consulted? Not Applicable   Payton Mccallum, MD 12/15/17 [phone]

## 2017-12-15 NOTE — ED Triage Notes (Signed)
Pt state she was at [employer] yesterday and stepped in an uneven spot and hit her knees and said she did have knee replacement on her right knee and states it did lock up and wanted to get it checked out. Both knees hurt but was more concerned with her right knee. States she does feel "popping, swelling, and grinding"  And did not try anything otc.

## 2017-12-22 DIAGNOSIS — F310 Bipolar disorder, current episode hypomanic: Secondary | ICD-10-CM | POA: Diagnosis not present

## 2018-01-01 DIAGNOSIS — F310 Bipolar disorder, current episode hypomanic: Secondary | ICD-10-CM | POA: Diagnosis not present

## 2018-01-07 ENCOUNTER — Encounter: Payer: BLUE CROSS/BLUE SHIELD | Admitting: Internal Medicine

## 2018-01-22 DIAGNOSIS — F310 Bipolar disorder, current episode hypomanic: Secondary | ICD-10-CM | POA: Diagnosis not present

## 2018-01-29 DIAGNOSIS — F310 Bipolar disorder, current episode hypomanic: Secondary | ICD-10-CM | POA: Diagnosis not present

## 2018-01-29 DIAGNOSIS — S838X2A Sprain of other specified parts of left knee, initial encounter: Secondary | ICD-10-CM | POA: Diagnosis not present

## 2018-02-26 DIAGNOSIS — F310 Bipolar disorder, current episode hypomanic: Secondary | ICD-10-CM | POA: Diagnosis not present

## 2018-03-05 DIAGNOSIS — F310 Bipolar disorder, current episode hypomanic: Secondary | ICD-10-CM | POA: Diagnosis not present

## 2018-03-27 DIAGNOSIS — J0101 Acute recurrent maxillary sinusitis: Secondary | ICD-10-CM | POA: Diagnosis not present

## 2018-04-02 DIAGNOSIS — R05 Cough: Secondary | ICD-10-CM | POA: Diagnosis not present

## 2018-04-02 DIAGNOSIS — R42 Dizziness and giddiness: Secondary | ICD-10-CM | POA: Diagnosis not present

## 2018-04-02 DIAGNOSIS — R51 Headache: Secondary | ICD-10-CM | POA: Diagnosis not present

## 2018-04-02 DIAGNOSIS — R197 Diarrhea, unspecified: Secondary | ICD-10-CM | POA: Diagnosis not present

## 2018-04-02 DIAGNOSIS — Z87891 Personal history of nicotine dependence: Secondary | ICD-10-CM | POA: Diagnosis not present

## 2018-04-02 DIAGNOSIS — R0602 Shortness of breath: Secondary | ICD-10-CM | POA: Diagnosis not present

## 2018-04-02 DIAGNOSIS — R112 Nausea with vomiting, unspecified: Secondary | ICD-10-CM | POA: Diagnosis not present

## 2018-04-06 DIAGNOSIS — H8301 Labyrinthitis, right ear: Secondary | ICD-10-CM | POA: Diagnosis not present

## 2018-04-10 DIAGNOSIS — Z6981 Encounter for mental health services for victim of other abuse: Secondary | ICD-10-CM | POA: Diagnosis not present

## 2018-04-10 DIAGNOSIS — F3170 Bipolar disorder, currently in remission, most recent episode unspecified: Secondary | ICD-10-CM | POA: Diagnosis not present

## 2018-04-10 DIAGNOSIS — F319 Bipolar disorder, unspecified: Secondary | ICD-10-CM | POA: Diagnosis not present

## 2018-04-10 DIAGNOSIS — R911 Solitary pulmonary nodule: Secondary | ICD-10-CM | POA: Diagnosis not present

## 2018-04-18 DIAGNOSIS — F329 Major depressive disorder, single episode, unspecified: Secondary | ICD-10-CM | POA: Diagnosis not present

## 2018-04-18 DIAGNOSIS — Z5321 Procedure and treatment not carried out due to patient leaving prior to being seen by health care provider: Secondary | ICD-10-CM | POA: Diagnosis not present

## 2018-04-22 DIAGNOSIS — F310 Bipolar disorder, current episode hypomanic: Secondary | ICD-10-CM | POA: Diagnosis not present

## 2018-04-28 DIAGNOSIS — R911 Solitary pulmonary nodule: Secondary | ICD-10-CM | POA: Diagnosis not present

## 2018-04-28 DIAGNOSIS — N200 Calculus of kidney: Secondary | ICD-10-CM | POA: Diagnosis not present

## 2018-05-07 DIAGNOSIS — F310 Bipolar disorder, current episode hypomanic: Secondary | ICD-10-CM | POA: Diagnosis not present

## 2018-06-08 DIAGNOSIS — F310 Bipolar disorder, current episode hypomanic: Secondary | ICD-10-CM | POA: Diagnosis not present

## 2018-07-01 DIAGNOSIS — F319 Bipolar disorder, unspecified: Secondary | ICD-10-CM | POA: Diagnosis not present

## 2018-07-06 DIAGNOSIS — F319 Bipolar disorder, unspecified: Secondary | ICD-10-CM | POA: Diagnosis not present

## 2018-07-06 DIAGNOSIS — Z6830 Body mass index (BMI) 30.0-30.9, adult: Secondary | ICD-10-CM | POA: Diagnosis not present

## 2018-07-06 DIAGNOSIS — Z23 Encounter for immunization: Secondary | ICD-10-CM | POA: Diagnosis not present

## 2018-07-09 DIAGNOSIS — L538 Other specified erythematous conditions: Secondary | ICD-10-CM | POA: Diagnosis not present

## 2018-07-29 DIAGNOSIS — Z0000 Encounter for general adult medical examination without abnormal findings: Secondary | ICD-10-CM | POA: Diagnosis not present

## 2018-07-29 DIAGNOSIS — Z1239 Encounter for other screening for malignant neoplasm of breast: Secondary | ICD-10-CM | POA: Diagnosis not present

## 2018-07-29 DIAGNOSIS — Z5181 Encounter for therapeutic drug level monitoring: Secondary | ICD-10-CM | POA: Diagnosis not present

## 2018-07-29 DIAGNOSIS — Z124 Encounter for screening for malignant neoplasm of cervix: Secondary | ICD-10-CM | POA: Diagnosis not present

## 2018-07-29 DIAGNOSIS — Z79899 Other long term (current) drug therapy: Secondary | ICD-10-CM | POA: Diagnosis not present

## 2018-08-27 ENCOUNTER — Other Ambulatory Visit: Payer: Self-pay | Admitting: Internal Medicine

## 2018-08-27 DIAGNOSIS — K219 Gastro-esophageal reflux disease without esophagitis: Secondary | ICD-10-CM

## 2018-09-30 ENCOUNTER — Telehealth: Payer: Self-pay

## 2018-09-30 NOTE — Telephone Encounter (Signed)
Called and LVM telling patient she is overdue for physical and asked her to cb and schedule.   Awaiting cb.

## 2018-10-26 DIAGNOSIS — F319 Bipolar disorder, unspecified: Secondary | ICD-10-CM | POA: Diagnosis not present

## 2018-10-28 ENCOUNTER — Encounter (HOSPITAL_BASED_OUTPATIENT_CLINIC_OR_DEPARTMENT_OTHER): Payer: Self-pay

## 2018-10-28 ENCOUNTER — Other Ambulatory Visit: Payer: Self-pay

## 2018-10-28 ENCOUNTER — Emergency Department (HOSPITAL_BASED_OUTPATIENT_CLINIC_OR_DEPARTMENT_OTHER)
Admission: EM | Admit: 2018-10-28 | Discharge: 2018-10-28 | Disposition: A | Discharge status: Home / Self Care | Payer: BC Managed Care – PPO | Attending: Emergency Medicine | Admitting: Emergency Medicine

## 2018-10-28 ENCOUNTER — Emergency Department (HOSPITAL_BASED_OUTPATIENT_CLINIC_OR_DEPARTMENT_OTHER): Payer: BC Managed Care – PPO

## 2018-10-28 DIAGNOSIS — M179 Osteoarthritis of knee, unspecified: Secondary | ICD-10-CM | POA: Insufficient documentation

## 2018-10-28 DIAGNOSIS — M1712 Unilateral primary osteoarthritis, left knee: Secondary | ICD-10-CM | POA: Diagnosis not present

## 2018-10-28 DIAGNOSIS — M25562 Pain in left knee: Secondary | ICD-10-CM | POA: Diagnosis not present

## 2018-10-28 DIAGNOSIS — Z79899 Other long term (current) drug therapy: Secondary | ICD-10-CM | POA: Diagnosis not present

## 2018-10-28 DIAGNOSIS — Z87891 Personal history of nicotine dependence: Secondary | ICD-10-CM | POA: Insufficient documentation

## 2018-10-28 MED ORDER — DICLOFENAC SODIUM 1 % TD GEL: 2.0000 g | Freq: Four times a day (QID) | TRANSDERMAL | 0 refills | Status: AC

## 2018-10-28 NOTE — ED Provider Notes (Signed)
[facility] EMERGENCY DEPARTMENT Provider Note   CSN: [id number] Arrival date & time: 10/28/18  1842    History   Chief Complaint Chief Complaint  Patient presents with  . Knee Pain    HPI [name] is a 48 y.o. female with history of bipolar disorder, osteoarthritis who presents with left knee pain that has been progressively worsening over the past few weeks.  Patient reports progressive swelling over the past 5 to 6 days.  She denies any fever.  She reports going through similar symptoms prior to her right knee replacement.  She denies any new trauma.  She has been taking ibuprofen with some relief.  She is also been using ice.  She sees Dr. Despina Hick with orthopedics.     HPI  Past Medical History:  Diagnosis Date  . Arthritis   . Bipolar disorder (HCC)   . Depression   . GERD (gastroesophageal reflux disease)   . Headache(784.0)    migraines-  "stress and hormonal"  . Mental disorder    bipolar/ LOV Dr Lolly Mustache  LOV 4/13 EPIC  . PCO (polycystic ovaries)   . Sleep apnea    no CPAP after 2007 after 150 lbs weight loss  . Stress incontinence, female     Patient Active Problem List   Diagnosis Date Noted  . OA (osteoarthritis) of knee 02/26/2012  . Hirsutism 09/19/2006  . Polycystic ovaries 05/31/2006  . Anxiety state 05/31/2006  . Migraine 05/31/2006  . Environmental and seasonal allergies 05/31/2006  . GERD (gastroesophageal reflux disease) 05/31/2006  . Urinary incontinence in female 05/31/2006  . History of drug overdose 05/31/2006    Past Surgical History:  Procedure Laterality Date  . CHOLECYSTECTOMY  02/22/2017  . TOTAL KNEE ARTHROPLASTY  02/26/2012   Procedure: TOTAL KNEE ARTHROPLASTY;  Surgeon: Loanne Drilling, MD;  Location: [facility];  Service: Orthopedics;  Laterality: Right;     OB History   No obstetric history on file.      Home Medications    Prior to Admission medications   Medication Sig Start Date End Date Taking?  Authorizing Provider  buPROPion (WELLBUTRIN XL) 300 MG 24 hr tablet Take 1 tablet (300 mg total) by mouth daily with breakfast. 06/07/14   Myrlene Broker, MD  cetirizine (ZYRTEC) 10 MG tablet Take 1 tablet (10 mg total) by mouth daily. 07/08/17   Reubin Milan, MD  cyclobenzaprine (FLEXERIL) 10 MG tablet Take 1 tablet (10 mg total) by mouth at bedtime. PRN 10/27/17   Payton Mccallum, MD  diclofenac sodium (VOLTAREN) 1 % GEL Apply 2 g topically 4 (four) times daily. 10/28/18   Emi Holes, PA-C  HYDROcodone-acetaminophen (NORCO/VICODIN) 5-325 MG tablet 1-2 tabs po q 8 hours prn 12/15/17   Payton Mccallum, MD  Levonorgestrel-Ethinyl Estradiol (AMETHIA,CAMRESE) 0.1-0.02 & 0.01 MG tablet Take 1 tablet by mouth daily. 07/08/17   Reubin Milan, MD  lithium carbonate (ESKALITH) 450 MG CR tablet Take 2 tablets (900 mg total) by mouth at bedtime. Patient taking differently: Take 1,200 mg by mouth at bedtime.  06/07/14   Myrlene Broker, MD  pantoprazole (PROTONIX) 40 MG tablet Take 1 tablet (40 mg total) by mouth 2 (two) times daily. 07/08/17   Reubin Milan, MD  traZODone (DESYREL) 50 MG tablet Take 100 mg by mouth at bedtime.    [provider]    Family History Family History  Problem Relation Age of Onset  . Dementia Mother   . Depression  Mother   . Alcohol abuse Father   . Alcohol abuse Brother   . Drug abuse Brother   . Dementia Maternal Grandmother   . ADD / ADHD Neg Hx   . Anxiety disorder Neg Hx   . Bipolar disorder Neg Hx   . OCD Neg Hx   . Physical abuse Neg Hx   . Paranoid behavior Neg Hx   . Schizophrenia Neg Hx   . Seizures Neg Hx   . Sexual abuse Neg Hx     Social History Social History   Tobacco Use  . Smoking status: Former Smoker    Last attempt to quit: 06/11/2009    Years since quitting: 9.3  . Smokeless tobacco: Never Used  Substance Use Topics  . Alcohol use: No  . Drug use: No     Allergies   Klonopin [clonazepam]; Cefaclor; Milk-related  compounds; and Penicillins   Review of Systems Review of Systems  Constitutional: Negative for fever.  Musculoskeletal: Positive for arthralgias and joint swelling.     Physical Exam Updated Vital Signs BP 114/66 (BP Location: Right Arm)   Pulse 73   Temp 98.4 F (36.9 C) (Oral)   Resp 20   Ht 5\' 7"  (1.702 m)   Wt 93 kg   LMP 10/26/2018   SpO2 98%   BMI 32.11 kg/m   Physical Exam Vitals signs and nursing note reviewed.  Constitutional:      General: She is not in acute distress.    Appearance: She is well-developed. She is not diaphoretic.  HENT:     Head: Normocephalic and atraumatic.     Mouth/Throat:     Pharynx: No oropharyngeal exudate.  Eyes:     General: No scleral icterus.       Right eye: No discharge.        Left eye: No discharge.     Conjunctiva/sclera: Conjunctivae normal.     Pupils: Pupils are equal, round, and reactive to light.  Neck:     Musculoskeletal: Normal range of motion and neck supple.     Thyroid: No thyromegaly.  Cardiovascular:     Rate and Rhythm: Normal rate and regular rhythm.     Heart sounds: Normal heart sounds. No murmur. No friction rub. No gallop.   Pulmonary:     Effort: Pulmonary effort is normal. No respiratory distress.     Breath sounds: Normal breath sounds. No stridor. No wheezing or rales.  Abdominal:     General: There is no distension.  Musculoskeletal:     Comments: L knee: Mild effusion noted, patellar laxity and crepitus, no erythema or warmth; very mild pain with passive range of motion; negative anterior/posterior drawer, negative McMurray's test; straight leg raise intact  Lymphadenopathy:     Cervical: No cervical adenopathy.  Skin:    General: Skin is warm and dry.     Coloration: Skin is not pale.     Findings: No rash.  Neurological:     Mental Status: She is alert.     Coordination: Coordination normal.      ED Treatments / Results  Labs (all labs ordered are listed, but only abnormal results  are displayed) Labs Reviewed - No data to display  EKG None  Radiology Dg Knee Complete 4 Views Left  Result Date: 10/28/2018 CLINICAL DATA:  Pain and swelling EXAM: LEFT KNEE - COMPLETE 4+ VIEW COMPARISON:  Left knee MRI August 03, 2009 FINDINGS: Frontal, lateral, and bilateral oblique views were obtained.  There is no acute fracture or dislocation. There is mild lateral patellar subluxation. There is no appreciable joint effusion. There is moderate narrowing of the patellofemoral joint. There is spurring in all compartments. There is chondrocalcinosis. There is calcification lateral to the patella which may represent a degree of myositis ossificans. No erosive changes. IMPRESSION: Generalized osteoarthritic change with narrowing greatest in the patellofemoral joint. Foci of chondrocalcinosis may be seen with osteoarthritis or with calcium pyrophosphate deposition disease. 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 There is mild lateral patellar subluxation. No fracture or dislocation. No appreciable joint effusion. Electronically Signed   By: Bretta Bang III M.D.   On: 10/28/2018 20:32    Procedures Procedures (including critical care time)  Medications Ordered in ED Medications - No data to display   Initial Impression / Assessment and Plan / ED Course  I have reviewed the triage vital signs and the nursing notes.  Pertinent labs & imaging results that were available during my care of the patient were reviewed by me and considered in my medical decision making (see chart for details).        Patient presenting with ongoing left knee pain worsening over the past few days.  Patient has some swelling on exam, but no appreciable joint effusion on x-ray.  Low suspicion of septic joint at this time.  No indication for emergent arthrocentesis today.  X-ray shows generalized osteoarthritic change with narrowing greatest in the patellofemoral joint, which correlates with exam.   Patient also has foci of questionable calcinosis and mild lateral patellar subluxation.  Patient is on lithium, so NSAIDs should be used sparingly.  She would like to speak with her psychiatrist prior to taking Mobic, which I offered.  She has been taking ibuprofen over-the-counter, however.  Patient cautioned with this.  Will give Voltaren considering low systemic.  Knee sleeve provided.  Follow-up to orthopedic doctor.  Return precautions discussed.  Patient vitals stable throughout ED course and discharged in satisfactory condition.  Final Clinical Impressions(s) / ED Diagnoses   Final diagnoses:  Acute pain of left knee    ED Discharge Orders         Ordered    diclofenac sodium (VOLTAREN) 1 % GEL  4 times daily     10/28/18 2056           Emi Holes, PA-C 10/28/18 2107    Vanetta Mulders, MD 11/08/18 [phone]

## 2018-10-28 NOTE — ED Triage Notes (Signed)
C/o pain/swelling to left knee-denies injury-hx of same-NAD-limping gait

## 2018-10-28 NOTE — Discharge Instructions (Addendum)
Talk to your psychiatrist before continuing ibuprofen.  Take Tylenol as described over-the-counter.  Apply Voltaren gel 4 times daily to your knee as needed.  Use ice 3-4 times daily alternating 20 minutes on, 20 minutes off.  Wear the knee sleeve for support.  Please follow-up with your orthopedic doctor for further evaluation and treatment of your arthritis and subluxation of patella.  Please return the emergency department develop any new or worsening symptoms including fever, redness, warmth of your knee, or inability to even move it a small amount.

## 2018-10-30 DIAGNOSIS — M25562 Pain in left knee: Secondary | ICD-10-CM | POA: Diagnosis not present

## 2018-10-30 DIAGNOSIS — G8929 Other chronic pain: Secondary | ICD-10-CM | POA: Diagnosis not present

## 2018-10-30 DIAGNOSIS — Z6831 Body mass index (BMI) 31.0-31.9, adult: Secondary | ICD-10-CM | POA: Diagnosis not present

## 2018-11-12 DIAGNOSIS — M1712 Unilateral primary osteoarthritis, left knee: Secondary | ICD-10-CM | POA: Diagnosis not present

## 2018-11-12 DIAGNOSIS — M222X2 Patellofemoral disorders, left knee: Secondary | ICD-10-CM | POA: Diagnosis not present

## 2018-11-23 DIAGNOSIS — F310 Bipolar disorder, current episode hypomanic: Secondary | ICD-10-CM | POA: Diagnosis not present

## 2018-11-25 DIAGNOSIS — M71862 Other specified bursopathies, left knee: Secondary | ICD-10-CM | POA: Diagnosis not present

## 2018-11-25 DIAGNOSIS — M67864 Other specified disorders of tendon, left knee: Secondary | ICD-10-CM | POA: Diagnosis not present

## 2018-11-25 DIAGNOSIS — M7989 Other specified soft tissue disorders: Secondary | ICD-10-CM | POA: Diagnosis not present

## 2018-11-25 DIAGNOSIS — M94262 Chondromalacia, left knee: Secondary | ICD-10-CM | POA: Diagnosis not present

## 2018-11-25 DIAGNOSIS — M1712 Unilateral primary osteoarthritis, left knee: Secondary | ICD-10-CM | POA: Diagnosis not present

## 2018-11-25 DIAGNOSIS — S83242A Other tear of medial meniscus, current injury, left knee, initial encounter: Secondary | ICD-10-CM | POA: Diagnosis not present

## 2018-11-25 DIAGNOSIS — M25562 Pain in left knee: Secondary | ICD-10-CM | POA: Diagnosis not present

## 2018-11-26 DIAGNOSIS — Z5181 Encounter for therapeutic drug level monitoring: Secondary | ICD-10-CM | POA: Diagnosis not present

## 2018-11-26 DIAGNOSIS — Z0000 Encounter for general adult medical examination without abnormal findings: Secondary | ICD-10-CM | POA: Diagnosis not present

## 2018-11-26 DIAGNOSIS — Z79899 Other long term (current) drug therapy: Secondary | ICD-10-CM | POA: Diagnosis not present

## 2018-12-02 DIAGNOSIS — F310 Bipolar disorder, current episode hypomanic: Secondary | ICD-10-CM | POA: Diagnosis not present

## 2018-12-03 DIAGNOSIS — S90861A Insect bite (nonvenomous), right foot, initial encounter: Secondary | ICD-10-CM | POA: Diagnosis not present

## 2018-12-04 DIAGNOSIS — M222X2 Patellofemoral disorders, left knee: Secondary | ICD-10-CM | POA: Diagnosis not present

## 2018-12-04 DIAGNOSIS — M1712 Unilateral primary osteoarthritis, left knee: Secondary | ICD-10-CM | POA: Diagnosis not present

## 2018-12-04 DIAGNOSIS — S83232A Complex tear of medial meniscus, current injury, left knee, initial encounter: Secondary | ICD-10-CM | POA: Diagnosis not present

## 2018-12-11 DIAGNOSIS — M1712 Unilateral primary osteoarthritis, left knee: Secondary | ICD-10-CM | POA: Diagnosis not present

## 2018-12-11 DIAGNOSIS — M222X2 Patellofemoral disorders, left knee: Secondary | ICD-10-CM | POA: Diagnosis not present

## 2018-12-11 DIAGNOSIS — S83232A Complex tear of medial meniscus, current injury, left knee, initial encounter: Secondary | ICD-10-CM | POA: Diagnosis not present

## 2018-12-16 DIAGNOSIS — M94262 Chondromalacia, left knee: Secondary | ICD-10-CM | POA: Diagnosis not present

## 2018-12-16 DIAGNOSIS — M67462 Ganglion, left knee: Secondary | ICD-10-CM | POA: Diagnosis not present

## 2018-12-16 DIAGNOSIS — M1712 Unilateral primary osteoarthritis, left knee: Secondary | ICD-10-CM | POA: Diagnosis not present

## 2018-12-22 DIAGNOSIS — Z79899 Other long term (current) drug therapy: Secondary | ICD-10-CM | POA: Diagnosis not present

## 2018-12-22 DIAGNOSIS — Z01818 Encounter for other preprocedural examination: Secondary | ICD-10-CM | POA: Diagnosis not present

## 2018-12-22 DIAGNOSIS — Z5181 Encounter for therapeutic drug level monitoring: Secondary | ICD-10-CM | POA: Diagnosis not present

## 2018-12-22 DIAGNOSIS — Z6832 Body mass index (BMI) 32.0-32.9, adult: Secondary | ICD-10-CM | POA: Diagnosis not present

## 2019-02-02 DIAGNOSIS — F310 Bipolar disorder, current episode hypomanic: Secondary | ICD-10-CM | POA: Diagnosis not present

## 2019-02-03 DIAGNOSIS — S83232A Complex tear of medial meniscus, current injury, left knee, initial encounter: Secondary | ICD-10-CM | POA: Diagnosis not present

## 2019-02-03 DIAGNOSIS — M222X2 Patellofemoral disorders, left knee: Secondary | ICD-10-CM | POA: Diagnosis not present

## 2019-02-03 DIAGNOSIS — M1712 Unilateral primary osteoarthritis, left knee: Secondary | ICD-10-CM | POA: Diagnosis not present

## 2019-02-04 DIAGNOSIS — Z1159 Encounter for screening for other viral diseases: Secondary | ICD-10-CM | POA: Diagnosis not present

## 2019-02-09 DIAGNOSIS — S83232D Complex tear of medial meniscus, current injury, left knee, subsequent encounter: Secondary | ICD-10-CM | POA: Diagnosis not present

## 2019-02-09 DIAGNOSIS — M1712 Unilateral primary osteoarthritis, left knee: Secondary | ICD-10-CM | POA: Diagnosis not present

## 2019-02-09 DIAGNOSIS — M222X2 Patellofemoral disorders, left knee: Secondary | ICD-10-CM | POA: Diagnosis not present

## 2019-02-09 DIAGNOSIS — M25562 Pain in left knee: Secondary | ICD-10-CM | POA: Diagnosis not present

## 2019-02-11 DIAGNOSIS — M1712 Unilateral primary osteoarthritis, left knee: Secondary | ICD-10-CM | POA: Diagnosis not present

## 2019-02-12 DIAGNOSIS — Z471 Aftercare following joint replacement surgery: Secondary | ICD-10-CM | POA: Diagnosis not present

## 2019-02-12 DIAGNOSIS — N200 Calculus of kidney: Secondary | ICD-10-CM | POA: Diagnosis not present

## 2019-02-12 DIAGNOSIS — Z87891 Personal history of nicotine dependence: Secondary | ICD-10-CM | POA: Diagnosis not present

## 2019-02-12 DIAGNOSIS — Z96653 Presence of artificial knee joint, bilateral: Secondary | ICD-10-CM | POA: Diagnosis not present

## 2019-02-12 DIAGNOSIS — F319 Bipolar disorder, unspecified: Secondary | ICD-10-CM | POA: Diagnosis not present

## 2019-02-12 DIAGNOSIS — Z7982 Long term (current) use of aspirin: Secondary | ICD-10-CM | POA: Diagnosis not present

## 2019-02-12 DIAGNOSIS — Z79891 Long term (current) use of opiate analgesic: Secondary | ICD-10-CM | POA: Diagnosis not present

## 2019-02-12 DIAGNOSIS — M549 Dorsalgia, unspecified: Secondary | ICD-10-CM | POA: Diagnosis not present

## 2019-02-12 DIAGNOSIS — K219 Gastro-esophageal reflux disease without esophagitis: Secondary | ICD-10-CM | POA: Diagnosis not present

## 2019-02-12 DIAGNOSIS — G4700 Insomnia, unspecified: Secondary | ICD-10-CM | POA: Diagnosis not present

## 2019-02-12 DIAGNOSIS — Z9181 History of falling: Secondary | ICD-10-CM | POA: Diagnosis not present

## 2019-02-15 DIAGNOSIS — K219 Gastro-esophageal reflux disease without esophagitis: Secondary | ICD-10-CM | POA: Diagnosis not present

## 2019-02-15 DIAGNOSIS — Z7982 Long term (current) use of aspirin: Secondary | ICD-10-CM | POA: Diagnosis not present

## 2019-02-15 DIAGNOSIS — N200 Calculus of kidney: Secondary | ICD-10-CM | POA: Diagnosis not present

## 2019-02-15 DIAGNOSIS — Z87891 Personal history of nicotine dependence: Secondary | ICD-10-CM | POA: Diagnosis not present

## 2019-02-15 DIAGNOSIS — Z96653 Presence of artificial knee joint, bilateral: Secondary | ICD-10-CM | POA: Diagnosis not present

## 2019-02-15 DIAGNOSIS — Z9181 History of falling: Secondary | ICD-10-CM | POA: Diagnosis not present

## 2019-02-15 DIAGNOSIS — M549 Dorsalgia, unspecified: Secondary | ICD-10-CM | POA: Diagnosis not present

## 2019-02-15 DIAGNOSIS — Z471 Aftercare following joint replacement surgery: Secondary | ICD-10-CM | POA: Diagnosis not present

## 2019-02-15 DIAGNOSIS — F319 Bipolar disorder, unspecified: Secondary | ICD-10-CM | POA: Diagnosis not present

## 2019-02-15 DIAGNOSIS — G4700 Insomnia, unspecified: Secondary | ICD-10-CM | POA: Diagnosis not present

## 2019-02-15 DIAGNOSIS — Z79891 Long term (current) use of opiate analgesic: Secondary | ICD-10-CM | POA: Diagnosis not present

## 2019-02-17 DIAGNOSIS — Z7982 Long term (current) use of aspirin: Secondary | ICD-10-CM | POA: Diagnosis not present

## 2019-02-17 DIAGNOSIS — N200 Calculus of kidney: Secondary | ICD-10-CM | POA: Diagnosis not present

## 2019-02-17 DIAGNOSIS — Z87891 Personal history of nicotine dependence: Secondary | ICD-10-CM | POA: Diagnosis not present

## 2019-02-17 DIAGNOSIS — G4700 Insomnia, unspecified: Secondary | ICD-10-CM | POA: Diagnosis not present

## 2019-02-17 DIAGNOSIS — Z471 Aftercare following joint replacement surgery: Secondary | ICD-10-CM | POA: Diagnosis not present

## 2019-02-17 DIAGNOSIS — Z9181 History of falling: Secondary | ICD-10-CM | POA: Diagnosis not present

## 2019-02-17 DIAGNOSIS — K219 Gastro-esophageal reflux disease without esophagitis: Secondary | ICD-10-CM | POA: Diagnosis not present

## 2019-02-17 DIAGNOSIS — Z79891 Long term (current) use of opiate analgesic: Secondary | ICD-10-CM | POA: Diagnosis not present

## 2019-02-17 DIAGNOSIS — Z96653 Presence of artificial knee joint, bilateral: Secondary | ICD-10-CM | POA: Diagnosis not present

## 2019-02-17 DIAGNOSIS — F319 Bipolar disorder, unspecified: Secondary | ICD-10-CM | POA: Diagnosis not present

## 2019-02-17 DIAGNOSIS — M549 Dorsalgia, unspecified: Secondary | ICD-10-CM | POA: Diagnosis not present

## 2019-02-19 DIAGNOSIS — Z471 Aftercare following joint replacement surgery: Secondary | ICD-10-CM | POA: Diagnosis not present

## 2019-02-19 DIAGNOSIS — Z96653 Presence of artificial knee joint, bilateral: Secondary | ICD-10-CM | POA: Diagnosis not present

## 2019-02-19 DIAGNOSIS — K219 Gastro-esophageal reflux disease without esophagitis: Secondary | ICD-10-CM | POA: Diagnosis not present

## 2019-02-19 DIAGNOSIS — Z87891 Personal history of nicotine dependence: Secondary | ICD-10-CM | POA: Diagnosis not present

## 2019-02-19 DIAGNOSIS — M549 Dorsalgia, unspecified: Secondary | ICD-10-CM | POA: Diagnosis not present

## 2019-02-19 DIAGNOSIS — N200 Calculus of kidney: Secondary | ICD-10-CM | POA: Diagnosis not present

## 2019-02-19 DIAGNOSIS — G4700 Insomnia, unspecified: Secondary | ICD-10-CM | POA: Diagnosis not present

## 2019-02-19 DIAGNOSIS — Z7982 Long term (current) use of aspirin: Secondary | ICD-10-CM | POA: Diagnosis not present

## 2019-02-19 DIAGNOSIS — Z79891 Long term (current) use of opiate analgesic: Secondary | ICD-10-CM | POA: Diagnosis not present

## 2019-02-19 DIAGNOSIS — F319 Bipolar disorder, unspecified: Secondary | ICD-10-CM | POA: Diagnosis not present

## 2019-02-19 DIAGNOSIS — Z9181 History of falling: Secondary | ICD-10-CM | POA: Diagnosis not present

## 2019-02-23 DIAGNOSIS — Z9181 History of falling: Secondary | ICD-10-CM | POA: Diagnosis not present

## 2019-02-23 DIAGNOSIS — F319 Bipolar disorder, unspecified: Secondary | ICD-10-CM | POA: Diagnosis not present

## 2019-02-23 DIAGNOSIS — Z7982 Long term (current) use of aspirin: Secondary | ICD-10-CM | POA: Diagnosis not present

## 2019-02-23 DIAGNOSIS — Z79891 Long term (current) use of opiate analgesic: Secondary | ICD-10-CM | POA: Diagnosis not present

## 2019-02-23 DIAGNOSIS — M549 Dorsalgia, unspecified: Secondary | ICD-10-CM | POA: Diagnosis not present

## 2019-02-23 DIAGNOSIS — N200 Calculus of kidney: Secondary | ICD-10-CM | POA: Diagnosis not present

## 2019-02-23 DIAGNOSIS — Z87891 Personal history of nicotine dependence: Secondary | ICD-10-CM | POA: Diagnosis not present

## 2019-02-23 DIAGNOSIS — Z471 Aftercare following joint replacement surgery: Secondary | ICD-10-CM | POA: Diagnosis not present

## 2019-02-23 DIAGNOSIS — G4700 Insomnia, unspecified: Secondary | ICD-10-CM | POA: Diagnosis not present

## 2019-02-23 DIAGNOSIS — Z96653 Presence of artificial knee joint, bilateral: Secondary | ICD-10-CM | POA: Diagnosis not present

## 2019-02-23 DIAGNOSIS — K219 Gastro-esophageal reflux disease without esophagitis: Secondary | ICD-10-CM | POA: Diagnosis not present

## 2019-02-24 DIAGNOSIS — Z96653 Presence of artificial knee joint, bilateral: Secondary | ICD-10-CM | POA: Diagnosis not present

## 2019-02-25 DIAGNOSIS — Z87891 Personal history of nicotine dependence: Secondary | ICD-10-CM | POA: Diagnosis not present

## 2019-02-25 DIAGNOSIS — Z471 Aftercare following joint replacement surgery: Secondary | ICD-10-CM | POA: Diagnosis not present

## 2019-02-25 DIAGNOSIS — Z9181 History of falling: Secondary | ICD-10-CM | POA: Diagnosis not present

## 2019-02-25 DIAGNOSIS — K219 Gastro-esophageal reflux disease without esophagitis: Secondary | ICD-10-CM | POA: Diagnosis not present

## 2019-02-25 DIAGNOSIS — M549 Dorsalgia, unspecified: Secondary | ICD-10-CM | POA: Diagnosis not present

## 2019-02-25 DIAGNOSIS — G4700 Insomnia, unspecified: Secondary | ICD-10-CM | POA: Diagnosis not present

## 2019-02-25 DIAGNOSIS — F319 Bipolar disorder, unspecified: Secondary | ICD-10-CM | POA: Diagnosis not present

## 2019-02-25 DIAGNOSIS — Z7982 Long term (current) use of aspirin: Secondary | ICD-10-CM | POA: Diagnosis not present

## 2019-02-25 DIAGNOSIS — Z79891 Long term (current) use of opiate analgesic: Secondary | ICD-10-CM | POA: Diagnosis not present

## 2019-02-25 DIAGNOSIS — N200 Calculus of kidney: Secondary | ICD-10-CM | POA: Diagnosis not present

## 2019-02-25 DIAGNOSIS — Z96653 Presence of artificial knee joint, bilateral: Secondary | ICD-10-CM | POA: Diagnosis not present

## 2019-03-01 DIAGNOSIS — M25662 Stiffness of left knee, not elsewhere classified: Secondary | ICD-10-CM | POA: Diagnosis not present

## 2019-03-01 DIAGNOSIS — M25562 Pain in left knee: Secondary | ICD-10-CM | POA: Diagnosis not present

## 2019-03-05 DIAGNOSIS — M25662 Stiffness of left knee, not elsewhere classified: Secondary | ICD-10-CM | POA: Diagnosis not present

## 2019-03-05 DIAGNOSIS — R269 Unspecified abnormalities of gait and mobility: Secondary | ICD-10-CM | POA: Diagnosis not present

## 2019-03-05 DIAGNOSIS — Z96652 Presence of left artificial knee joint: Secondary | ICD-10-CM | POA: Diagnosis not present

## 2019-03-05 DIAGNOSIS — M25562 Pain in left knee: Secondary | ICD-10-CM | POA: Diagnosis not present

## 2019-03-09 DIAGNOSIS — M25562 Pain in left knee: Secondary | ICD-10-CM | POA: Diagnosis not present

## 2019-03-09 DIAGNOSIS — M25662 Stiffness of left knee, not elsewhere classified: Secondary | ICD-10-CM | POA: Diagnosis not present

## 2019-03-09 DIAGNOSIS — Z96652 Presence of left artificial knee joint: Secondary | ICD-10-CM | POA: Diagnosis not present

## 2019-03-09 DIAGNOSIS — R269 Unspecified abnormalities of gait and mobility: Secondary | ICD-10-CM | POA: Diagnosis not present

## 2019-03-11 DIAGNOSIS — M25562 Pain in left knee: Secondary | ICD-10-CM | POA: Diagnosis not present

## 2019-03-11 DIAGNOSIS — R269 Unspecified abnormalities of gait and mobility: Secondary | ICD-10-CM | POA: Diagnosis not present

## 2019-03-11 DIAGNOSIS — M25662 Stiffness of left knee, not elsewhere classified: Secondary | ICD-10-CM | POA: Diagnosis not present

## 2019-03-11 DIAGNOSIS — Z96652 Presence of left artificial knee joint: Secondary | ICD-10-CM | POA: Diagnosis not present

## 2019-03-16 DIAGNOSIS — F310 Bipolar disorder, current episode hypomanic: Secondary | ICD-10-CM | POA: Diagnosis not present

## 2019-03-16 DIAGNOSIS — M25662 Stiffness of left knee, not elsewhere classified: Secondary | ICD-10-CM | POA: Diagnosis not present

## 2019-03-16 DIAGNOSIS — R269 Unspecified abnormalities of gait and mobility: Secondary | ICD-10-CM | POA: Diagnosis not present

## 2019-03-16 DIAGNOSIS — M25562 Pain in left knee: Secondary | ICD-10-CM | POA: Diagnosis not present

## 2019-03-16 DIAGNOSIS — Z96652 Presence of left artificial knee joint: Secondary | ICD-10-CM | POA: Diagnosis not present

## 2019-03-23 DIAGNOSIS — M25662 Stiffness of left knee, not elsewhere classified: Secondary | ICD-10-CM | POA: Diagnosis not present

## 2019-03-23 DIAGNOSIS — Z96652 Presence of left artificial knee joint: Secondary | ICD-10-CM | POA: Diagnosis not present

## 2019-03-23 DIAGNOSIS — R269 Unspecified abnormalities of gait and mobility: Secondary | ICD-10-CM | POA: Diagnosis not present

## 2019-03-23 DIAGNOSIS — M25562 Pain in left knee: Secondary | ICD-10-CM | POA: Diagnosis not present

## 2019-03-25 DIAGNOSIS — R269 Unspecified abnormalities of gait and mobility: Secondary | ICD-10-CM | POA: Diagnosis not present

## 2019-03-25 DIAGNOSIS — Z96652 Presence of left artificial knee joint: Secondary | ICD-10-CM | POA: Diagnosis not present

## 2019-03-25 DIAGNOSIS — M25562 Pain in left knee: Secondary | ICD-10-CM | POA: Diagnosis not present

## 2019-03-25 DIAGNOSIS — M25662 Stiffness of left knee, not elsewhere classified: Secondary | ICD-10-CM | POA: Diagnosis not present

## 2019-03-29 DIAGNOSIS — Z96652 Presence of left artificial knee joint: Secondary | ICD-10-CM | POA: Diagnosis not present

## 2019-03-29 DIAGNOSIS — M25662 Stiffness of left knee, not elsewhere classified: Secondary | ICD-10-CM | POA: Diagnosis not present

## 2019-03-29 DIAGNOSIS — M25562 Pain in left knee: Secondary | ICD-10-CM | POA: Diagnosis not present

## 2019-03-29 DIAGNOSIS — R269 Unspecified abnormalities of gait and mobility: Secondary | ICD-10-CM | POA: Diagnosis not present

## 2019-04-08 DIAGNOSIS — M25562 Pain in left knee: Secondary | ICD-10-CM | POA: Diagnosis not present

## 2019-04-08 DIAGNOSIS — R269 Unspecified abnormalities of gait and mobility: Secondary | ICD-10-CM | POA: Diagnosis not present

## 2019-04-08 DIAGNOSIS — M25662 Stiffness of left knee, not elsewhere classified: Secondary | ICD-10-CM | POA: Diagnosis not present

## 2019-04-08 DIAGNOSIS — Z96652 Presence of left artificial knee joint: Secondary | ICD-10-CM | POA: Diagnosis not present

## 2021-05-04 ENCOUNTER — Emergency Department (HOSPITAL_COMMUNITY)
Admission: EM | Admit: 2021-05-04 | Discharge: 2021-05-04 | Disposition: A | Discharge status: Home / Self Care | Payer: Medicaid Other | Attending: Emergency Medicine | Admitting: Emergency Medicine

## 2021-05-04 ENCOUNTER — Encounter (HOSPITAL_COMMUNITY): Payer: Self-pay | Admitting: *Deleted

## 2021-05-04 ENCOUNTER — Emergency Department (HOSPITAL_COMMUNITY): Payer: Medicaid Other

## 2021-05-04 DIAGNOSIS — Z96651 Presence of right artificial knee joint: Secondary | ICD-10-CM | POA: Insufficient documentation

## 2021-05-04 DIAGNOSIS — Y9302 Activity, running: Secondary | ICD-10-CM | POA: Insufficient documentation

## 2021-05-04 DIAGNOSIS — Z87891 Personal history of nicotine dependence: Secondary | ICD-10-CM | POA: Insufficient documentation

## 2021-05-04 DIAGNOSIS — W19XXXA Unspecified fall, initial encounter: Secondary | ICD-10-CM

## 2021-05-04 DIAGNOSIS — Y92009 Unspecified place in unspecified non-institutional (private) residence as the place of occurrence of the external cause: Secondary | ICD-10-CM

## 2021-05-04 DIAGNOSIS — W1839XA Other fall on same level, initial encounter: Secondary | ICD-10-CM | POA: Insufficient documentation

## 2021-05-04 DIAGNOSIS — Y92017 Garden or yard in single-family (private) house as the place of occurrence of the external cause: Secondary | ICD-10-CM | POA: Insufficient documentation

## 2021-05-04 DIAGNOSIS — Z79899 Other long term (current) drug therapy: Secondary | ICD-10-CM | POA: Insufficient documentation

## 2021-05-04 DIAGNOSIS — S83412A Sprain of medial collateral ligament of left knee, initial encounter: Secondary | ICD-10-CM | POA: Insufficient documentation

## 2021-05-04 MED ORDER — IBUPROFEN 600 MG PO TABS: 600.0000 mg | ORAL_TABLET | Freq: Four times a day (QID) | ORAL | 0 refills | Status: AC | PRN

## 2021-05-04 MED ORDER — CYCLOBENZAPRINE HCL 10 MG PO TABS: 10.0000 mg | ORAL_TABLET | Freq: Two times a day (BID) | ORAL | 0 refills | Status: AC | PRN

## 2021-05-04 NOTE — Discharge Instructions (Addendum)
You have been evaluated for your fall.  It appears that you may have sprained your left knee.  Please wear knee sleeve for support, take medication as prescribed.  You may follow-up with orthopedic specialist as needed for further management.

## 2021-05-04 NOTE — ED Triage Notes (Signed)
Fell at home, pain in left knee, right shoulder, and has back pain, states she fell on landscaping bricks, pain in right side of neck

## 2021-05-04 NOTE — ED Provider Notes (Signed)
[facility] EMERGENCY DEPARTMENT Provider Note   CSN: [id number] Arrival date & time: 05/04/21  1057     History Chief Complaint  Patient presents with   [name]    [name] is a 50 y.o. female.  The history is provided by the patient. No language interpreter was used.  Fall  50 year old female significant history of bipolar, arthritis, and history of drug overdose who presenting for evaluation of a fall.  Patient report yesterday she was babysitting her grandchildren and was chasing after them in the backyard when she stepped on an uneven ground, fell landed on her left knee and twisted her body as she fell.  She denies hitting her head or loss of consciousness but she did report having pain about the left knee as well as pain to her right shoulder, neck, and her right ankle.  Pain is sharp achy throbbing more significant to her left knee.  Pain worse with ambulation.  No numbness no fever.  Denies any precipitating symptoms prior to the fall.    Past Medical History:  Diagnosis Date   Arthritis    Bipolar disorder (HCC)    Depression    GERD (gastroesophageal reflux disease)    Headache(784.0)    migraines-  "stress and hormonal"   Mental disorder    bipolar/ LOV Dr Lolly Mustache  LOV 4/13 EPIC   PCO (polycystic ovaries)    Sleep apnea    no CPAP after 2007 after 150 lbs weight loss   Stress incontinence, female     Patient Active Problem List   Diagnosis Date Noted   OA (osteoarthritis) of knee 02/26/2012   Hirsutism 09/19/2006   Polycystic ovaries 05/31/2006   Anxiety state 05/31/2006   Migraine 05/31/2006   Environmental and seasonal allergies 05/31/2006   GERD (gastroesophageal reflux disease) 05/31/2006   Urinary incontinence in female 05/31/2006   History of drug overdose 05/31/2006    Past Surgical History:  Procedure Laterality Date   CHOLECYSTECTOMY  02/22/2017   TOTAL KNEE ARTHROPLASTY  02/26/2012   Procedure: TOTAL KNEE ARTHROPLASTY;  Surgeon: Loanne Drilling, MD;  Location: [facility];  Service: Orthopedics;  Laterality: Right;     OB History   No obstetric history on file.     Family History  Problem Relation Age of Onset   Dementia Mother    Depression Mother    Alcohol abuse Father    Alcohol abuse Brother    Drug abuse Brother    Dementia Maternal Grandmother    ADD / ADHD Neg Hx    Anxiety disorder Neg Hx    Bipolar disorder Neg Hx    OCD Neg Hx    Physical abuse Neg Hx    Paranoid behavior Neg Hx    Schizophrenia Neg Hx    Seizures Neg Hx    Sexual abuse Neg Hx     Social History   Tobacco Use   Smoking status: Former    Types: Cigarettes    Quit date: 06/11/2009    Years since quitting: 11.9   Smokeless tobacco: Never  Vaping Use   Vaping Use: Never used  Substance Use Topics   Alcohol use: No   Drug use: No    Home Medications Prior to Admission medications   Medication Sig Start Date End Date Taking? Authorizing Provider  buPROPion (WELLBUTRIN XL) 300 MG 24 hr tablet Take 1 tablet (300 mg total) by mouth daily with breakfast. 06/07/14   Myrlene Broker, MD  cetirizine (ZYRTEC) 10 MG tablet Take 1 tablet (10 mg total) by mouth daily. 07/08/17   Reubin Milan, MD  cyclobenzaprine (FLEXERIL) 10 MG tablet Take 1 tablet (10 mg total) by mouth at bedtime. PRN 10/27/17   Payton Mccallum, MD  diclofenac sodium (VOLTAREN) 1 % GEL Apply 2 g topically 4 (four) times daily. 10/28/18   Emi Holes, PA-C  HYDROcodone-acetaminophen (NORCO/VICODIN) 5-325 MG tablet 1-2 tabs po q 8 hours prn 12/15/17   Payton Mccallum, MD  Levonorgestrel-Ethinyl Estradiol (AMETHIA,CAMRESE) 0.1-0.02 & 0.01 MG tablet Take 1 tablet by mouth daily. 07/08/17   Reubin Milan, MD  lithium carbonate (ESKALITH) 450 MG CR tablet Take 2 tablets (900 mg total) by mouth at bedtime. Patient taking differently: Take 1,200 mg by mouth at bedtime.  06/07/14   Myrlene Broker, MD  pantoprazole (PROTONIX) 40 MG tablet Take 1 tablet (40 mg total) by mouth 2  (two) times daily. 07/08/17   Reubin Milan, MD  traZODone (DESYREL) 50 MG tablet Take 100 mg by mouth at bedtime.    [provider]    Allergies    Klonopin [clonazepam], Cefaclor, Milk-related compounds, and Penicillins  Review of Systems   Review of Systems  Constitutional:  Negative for fever.  Musculoskeletal:  Positive for arthralgias.  Skin:  Negative for wound.   Physical Exam Updated Vital Signs BP (!) 126/99   Pulse 84   Temp 97.7 F (36.5 C) (Oral)   Resp 18   SpO2 100%   Physical Exam Vitals and nursing note reviewed.  Constitutional:      General: She is not in acute distress.    Appearance: She is well-developed.  HENT:     Head: Atraumatic.  Eyes:     Conjunctiva/sclera: Conjunctivae normal.  Pulmonary:     Effort: Pulmonary effort is normal.  Musculoskeletal:        General: Tenderness (Left knee: Tenderness to medial joint line and suprapatellar region without significant edema or deformity noted.) present.     Cervical back: Neck supple. Tenderness (Tenderness to right cervical paraspinal muscle on palpation no significant midline spine tenderness.) present. No rigidity.     Comments: Mild tenderness to the right ankle with normal range of motion and no deformity.  Right shoulder nontender to palpation with normal range of motion.  Skin:    Findings: No rash.  Neurological:     Mental Status: She is alert.  Psychiatric:        Mood and Affect: Mood normal.    ED Results / Procedures / Treatments   Labs (all labs ordered are listed, but only abnormal results are displayed) Labs Reviewed - No data to display  EKG None  Radiology DG Shoulder Right  Result Date: 05/04/2021 CLINICAL DATA:  Right shoulder pain after fall yesterday. EXAM: RIGHT SHOULDER - 2+ VIEW COMPARISON:  None. FINDINGS: There is no evidence of fracture or dislocation. There is no evidence of arthropathy or other focal bone abnormality. Soft tissues are unremarkable.  IMPRESSION: Negative. Electronically Signed   By: Lupita Raider M.D.   On: 05/04/2021 12:15   DG Knee Complete 4 Views Left  Result Date: 05/04/2021 CLINICAL DATA:  Left knee pain after a fall. EXAM: LEFT KNEE - COMPLETE 4+ VIEW COMPARISON:  11/24/2018 FINDINGS: Left knee arthroplasty. No hardware complication or periprosthetic fracture. There may be a small suprapatellar joint effusion IMPRESSION: Possible small suprapatellar joint effusion. Otherwise, expected appearance after left knee arthroplasty. Electronically Signed  By: Jeronimo Greaves M.D.   On: 05/04/2021 12:15    Procedures Procedures   Medications Ordered in ED Medications - No data to display  ED Course  I have reviewed the triage vital signs and the nursing notes.  Pertinent labs & imaging results that were available during my care of the patient were reviewed by me and considered in my medical decision making (see chart for details).    MDM Rules/Calculators/A&P                           BP (!) 126/99   Pulse 84   Temp 97.7 F (36.5 C) (Oral)   Resp 18   SpO2 100%   Final Clinical Impression(s) / ED Diagnoses Final diagnoses:  Fall in home, initial encounter  Sprain of medial collateral ligament of left knee, initial encounter    Rx / DC Orders ED Discharge Orders          Ordered    ibuprofen (ADVIL) 600 MG tablet  Every 6 hours PRN        05/04/21 1503    cyclobenzaprine (FLEXERIL) 10 MG tablet  2 times daily PRN        05/04/21 1503           3:01 PM Patient had a mechanical fall yesterday and injured her left knee as well as having tenderness to her right side of neck, right ankle and right shoulder.  Left knee x-ray demonstrated possible small suprapatellar joint effusion otherwise unremarkable.  X-ray of the right shoulder unremarkable.  Will provide knee sleeves to left knee for symptom control, will discharge home with rice therapy and orthopedic referral given.  Return precaution given.    Fayrene Helper, PA-C 05/04/21 1507    Vanetta Mulders, MD 05/10/21 [phone]

## 2021-07-31 ENCOUNTER — Other Ambulatory Visit: Payer: Self-pay | Admitting: Internal Medicine

## 2021-07-31 ENCOUNTER — Other Ambulatory Visit (HOSPITAL_COMMUNITY): Payer: Self-pay | Admitting: Internal Medicine

## 2021-07-31 DIAGNOSIS — N83201 Unspecified ovarian cyst, right side: Secondary | ICD-10-CM

## 2021-08-06 ENCOUNTER — Other Ambulatory Visit: Payer: Self-pay

## 2021-08-06 ENCOUNTER — Ambulatory Visit (HOSPITAL_COMMUNITY)
Admission: RE | Admit: 2021-08-06 | Discharge: 2021-08-06 | Disposition: A | Discharge status: Home / Self Care | Payer: 59 | Source: Ambulatory Visit | Attending: Internal Medicine | Admitting: Internal Medicine

## 2021-08-06 ENCOUNTER — Other Ambulatory Visit (HOSPITAL_COMMUNITY): Payer: Self-pay | Admitting: Internal Medicine

## 2021-08-06 DIAGNOSIS — N83201 Unspecified ovarian cyst, right side: Secondary | ICD-10-CM

## 2021-08-08 ENCOUNTER — Ambulatory Visit (HOSPITAL_COMMUNITY): Payer: Medicaid Other

## 2021-09-19 ENCOUNTER — Ambulatory Visit (HOSPITAL_COMMUNITY)
Admission: RE | Admit: 2021-09-19 | Discharge: 2021-09-19 | Disposition: A | Discharge status: Home / Self Care | Payer: 59 | Source: Ambulatory Visit | Attending: Family Medicine | Admitting: Family Medicine

## 2021-09-19 ENCOUNTER — Other Ambulatory Visit (HOSPITAL_COMMUNITY): Payer: Self-pay | Admitting: Family Medicine

## 2021-09-19 DIAGNOSIS — M79672 Pain in left foot: Secondary | ICD-10-CM | POA: Insufficient documentation

## 2021-09-19 DIAGNOSIS — R52 Pain, unspecified: Secondary | ICD-10-CM

## 2021-10-01 ENCOUNTER — Other Ambulatory Visit (HOSPITAL_COMMUNITY): Payer: Self-pay | Admitting: Family Medicine

## 2021-10-01 ENCOUNTER — Other Ambulatory Visit: Payer: Self-pay | Admitting: Family Medicine

## 2021-10-01 DIAGNOSIS — R911 Solitary pulmonary nodule: Secondary | ICD-10-CM

## 2021-10-02 ENCOUNTER — Ambulatory Visit (HOSPITAL_COMMUNITY)
Admission: RE | Admit: 2021-10-02 | Discharge: 2021-10-02 | Disposition: A | Discharge status: Home / Self Care | Payer: 59 | Source: Ambulatory Visit | Attending: Family Medicine | Admitting: Family Medicine

## 2021-10-02 DIAGNOSIS — R911 Solitary pulmonary nodule: Secondary | ICD-10-CM | POA: Diagnosis present

## 2021-10-02 LAB — POCT I-STAT CREATININE: Creatinine, Ser: 1.1 mg/dL — ABNORMAL HIGH (ref 0.44–1.00)

## 2021-10-02 MED ORDER — IOHEXOL 300 MG/ML  SOLN
75.0000 mL | Freq: Once | INTRAMUSCULAR | Status: AC | PRN
  Administered 2021-10-02: 75 mL via INTRAVENOUS

## 2021-10-18 ENCOUNTER — Ambulatory Visit (HOSPITAL_COMMUNITY): Payer: 59

## 2021-11-21 ENCOUNTER — Ambulatory Visit (HOSPITAL_COMMUNITY)
Admission: RE | Admit: 2021-11-21 | Discharge: 2021-11-21 | Disposition: A | Discharge status: Home / Self Care | Payer: 59 | Source: Ambulatory Visit | Attending: Family Medicine | Admitting: Family Medicine

## 2021-11-21 ENCOUNTER — Other Ambulatory Visit (HOSPITAL_COMMUNITY): Payer: Self-pay | Admitting: Family Medicine

## 2021-11-21 DIAGNOSIS — Z1231 Encounter for screening mammogram for malignant neoplasm of breast: Secondary | ICD-10-CM

## 2022-09-30 IMAGING — CT CT CHEST W/ CM
2 of 4 series · 15 of 36 positions shown, 18 images · IV contrast (Omnipaque or Isovue)
Comparison: Chest radiograph dated 09/06/2021

CLINICAL DATA: Follow-up lung nodule from a portable chest
radiograph.

EXAM:
CT CHEST WITH CONTRAST
TECHNIQUE: Multidetector CT imaging of the chest was performed during
intravenous contrast administration.

[Series 2: routine chest with · axial · 0.74mm/px · z∈[+1219,+1461]mm · 12 of 144 slices shown, 15 images]
[im 12/144  mediastinal]
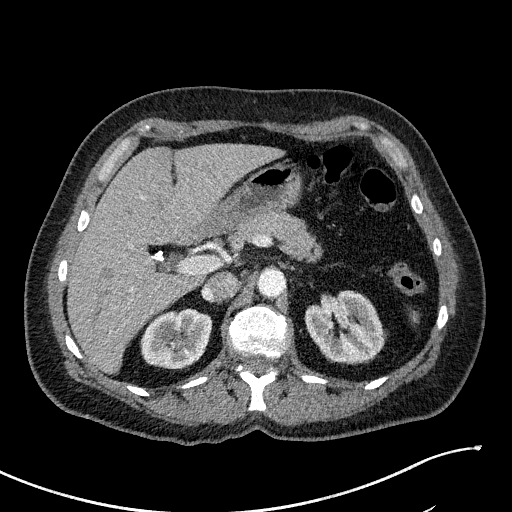
[im 12/144  lung]
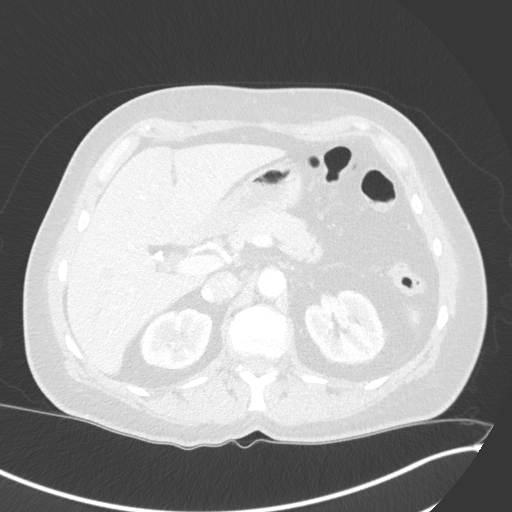
[im 23/144  lung]
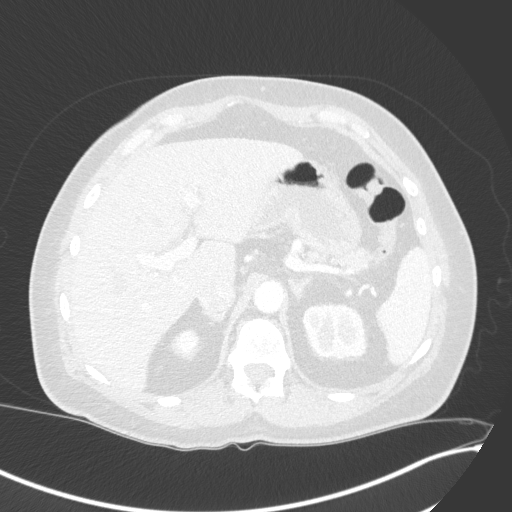
[im 34/144  lung]
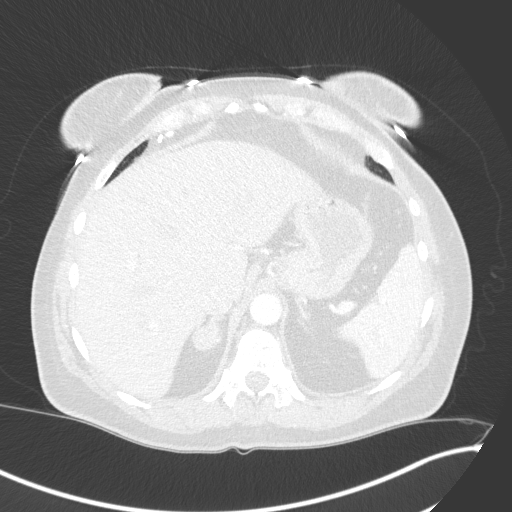
[im 45/144  lung]
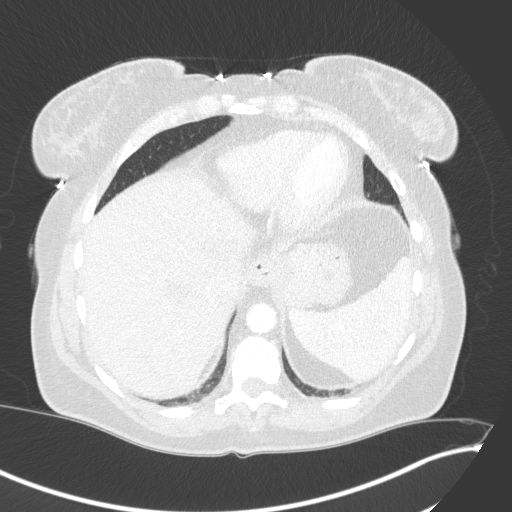
[im 56/144  mediastinal]
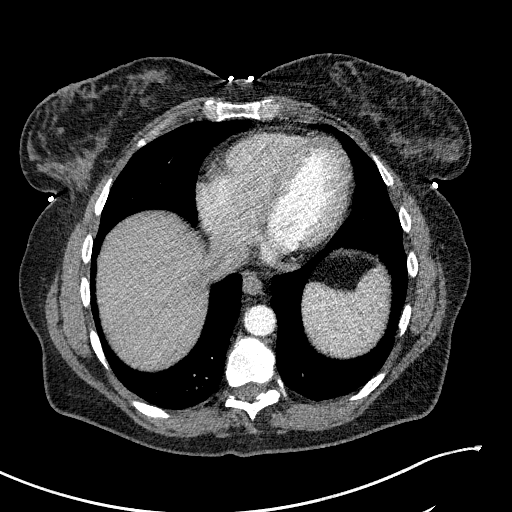
[im 56/144  lung]
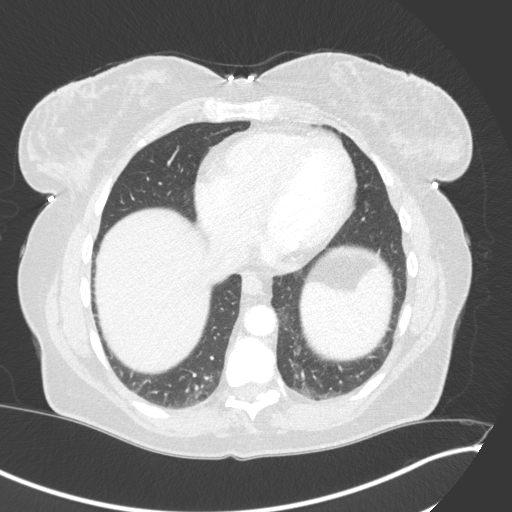
[im 67/144  lung]
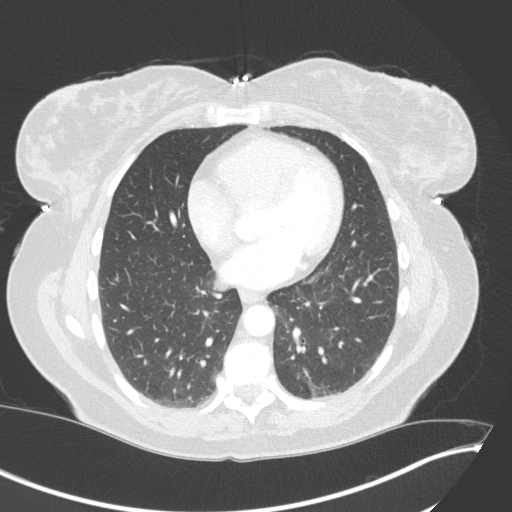
[im 78/144  lung]
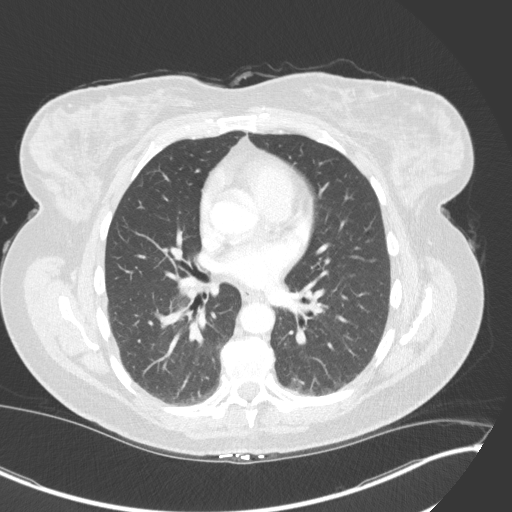
[im 89/144  lung]
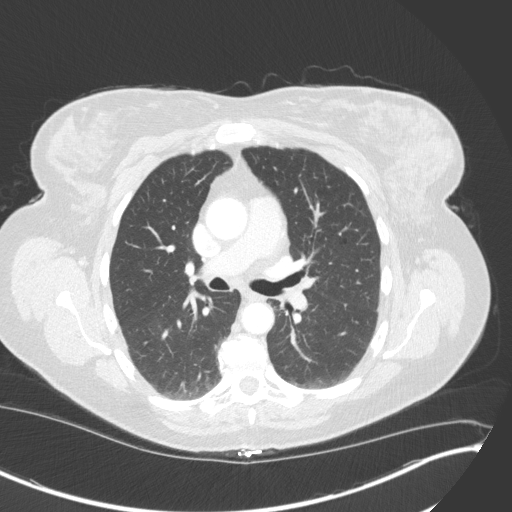
[im 100/144  mediastinal]
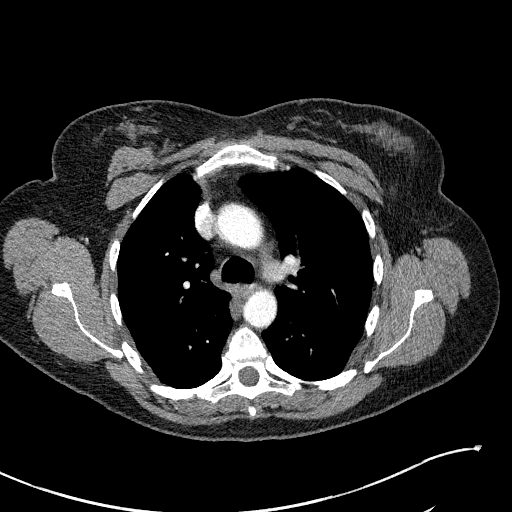
[im 100/144  lung]
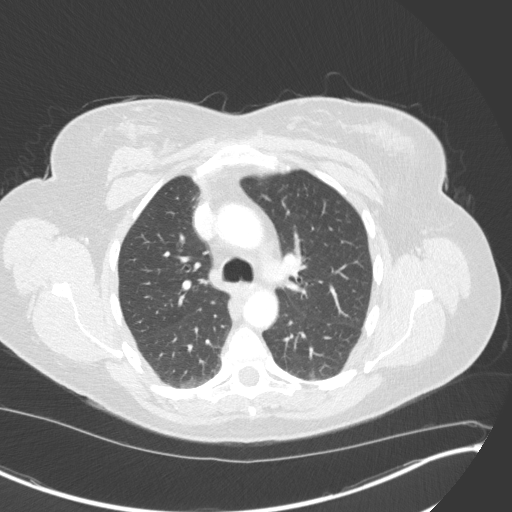
[im 111/144  lung]
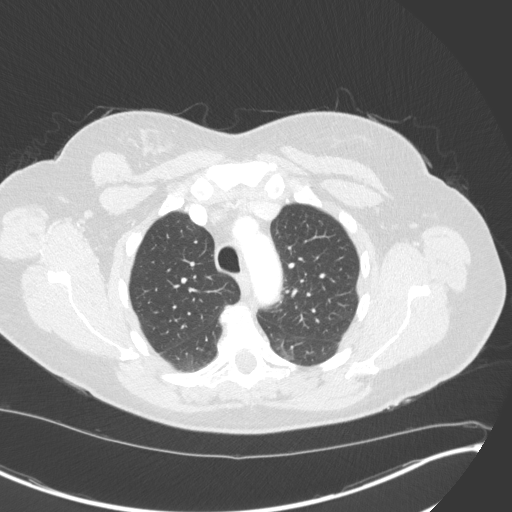
[im 122/144  lung]
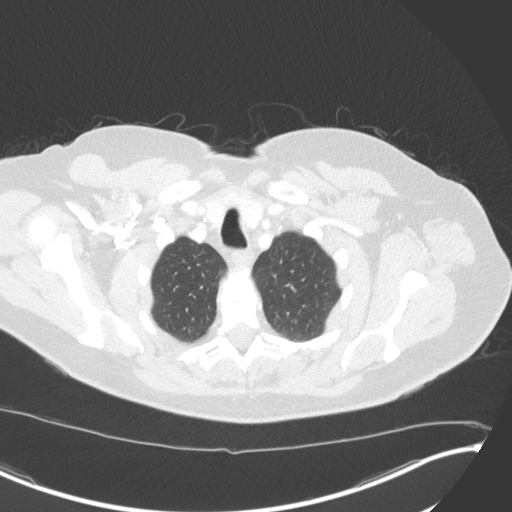
[im 133/144  lung]
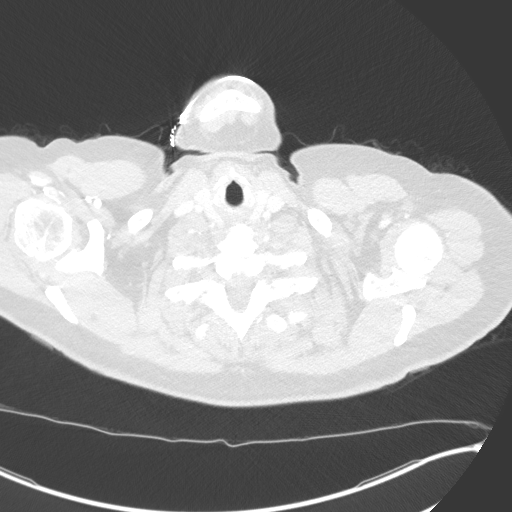

[Series 5: coronal · coronal · 0.62mm/px · 3 of 156 slices shown]
[im 32/156  lung]
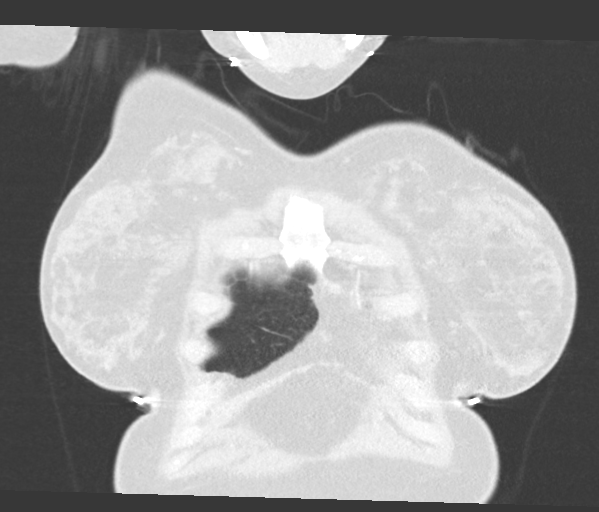
[im 63/156  lung]
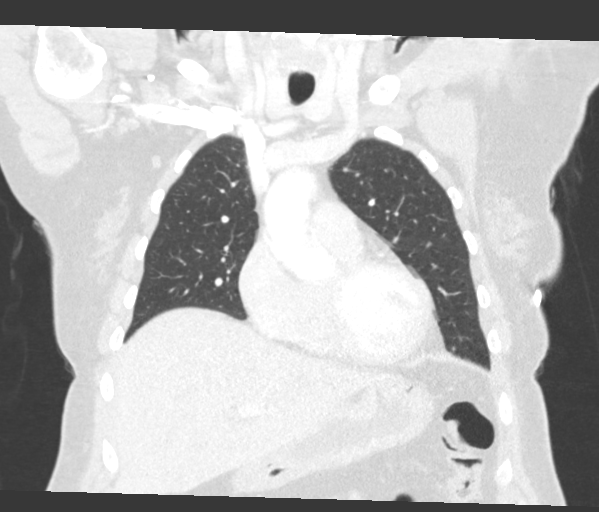
[im 94/156  lung]
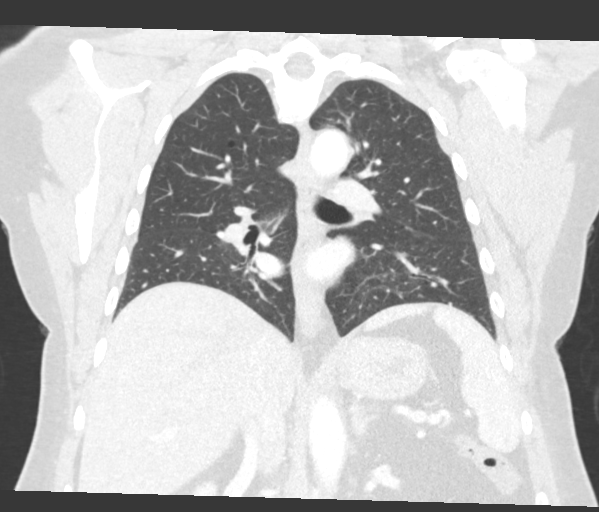

[15 of 36 positions shown; findings below may reference images not displayed]

RADIATION DOSE REDUCTION: This exam was performed according to the
departmental dose-optimization program which includes automated
exposure control, adjustment of the mA and/or kV according to
patient size and/or use of iterative reconstruction technique.

CONTRAST:  75mL OMNIPAQUE IOHEXOL 300 MG/ML  SOLN
FINDINGS: Cardiovascular: Heart is normal in size and configuration. No
pericardial effusion. Great vessels are normal in caliber. No aortic
dissection or atherosclerosis. Widely patent branch vessels.

Mediastinum/Nodes: No enlarged mediastinal, hilar, or axillary lymph
nodes. Thyroid gland, trachea, and esophagus demonstrate no
significant findings.

Lungs/Pleura: Clear lungs.  No pleural effusion or pneumothorax.

In the subpleural fat at the anterior base of the right middle lobe,
there are coarse, dystrophic and benign appearing, calcifications
associated with a focal area of prominent fat, position at the level
of the anterior sixth and seventh ribs. This is below the subtle
opacity noted on the recent prior chest radiograph.

Upper Abdomen: 1.9 cm a adrenal nodule a mean Hounsfield units of
66. No acute findings in the visualized upper abdomen.

Musculoskeletal: Sclerotic lesion noted in the anterolateral right
fourth rib, which accounts for the density noted on the recent chest
radiograph. This has the appearance of a benign bone island. No
aggressive/suspicious bone lesions. No fracture or acute finding.
IMPRESSION: 1. No acute findings.
2. Benign bone island in the anterolateral right fourth rib accounts
for the chest radiographic opacity. No follow-up indicated.
3. No lung masses or nodules.
4. 1.9 cm nonspecific right adrenal nodule. Recommend 1 year follow
up adrenal washout CT. If stable for > 1 year, no further f/u
imaging. JACR [DATE]):7488-44, JCAT 1637 [REDACTED];
40(2):194-200, Urol J [address]; 3(2):71-4.

## 2023-03-19 ENCOUNTER — Other Ambulatory Visit (HOSPITAL_COMMUNITY): Payer: Self-pay | Admitting: Family Medicine

## 2023-03-19 DIAGNOSIS — Z1231 Encounter for screening mammogram for malignant neoplasm of breast: Secondary | ICD-10-CM

## 2023-03-26 ENCOUNTER — Ambulatory Visit (HOSPITAL_COMMUNITY)
Admission: RE | Admit: 2023-03-26 | Discharge: 2023-03-26 | Disposition: A | Discharge status: Home / Self Care | Payer: MEDICAID | Source: Ambulatory Visit | Attending: Family Medicine | Admitting: Family Medicine

## 2023-03-26 DIAGNOSIS — Z1231 Encounter for screening mammogram for malignant neoplasm of breast: Secondary | ICD-10-CM | POA: Diagnosis present
